# Patient Record
Sex: Female | Born: 1975
Health system: Southern US, Community
[De-identification: ages and names within clinical notes are randomized; demographics above are authoritative.]

## PROBLEM LIST (undated history)

## (undated) DIAGNOSIS — Z6791 Unspecified blood type, Rh negative: Secondary | ICD-10-CM

## (undated) DIAGNOSIS — B379 Candidiasis, unspecified: Secondary | ICD-10-CM

## (undated) HISTORY — DX: Candidiasis, unspecified: B37.9

## (undated) HISTORY — DX: Unspecified blood type, rh negative: Z67.91

---

## 2003-02-23 ENCOUNTER — Inpatient Hospital Stay (HOSPITAL_COMMUNITY): Admission: AD | Admit: 2003-02-23 | Discharge: 2003-02-26 | Payer: Self-pay | Admitting: Obstetrics and Gynecology

## 2003-07-29 ENCOUNTER — Other Ambulatory Visit: Admission: RE | Admit: 2003-07-29 | Discharge: 2003-07-29 | Payer: Self-pay | Admitting: Obstetrics and Gynecology

## 2004-07-30 ENCOUNTER — Other Ambulatory Visit: Admission: RE | Admit: 2004-07-30 | Discharge: 2004-07-30 | Payer: Self-pay | Admitting: Obstetrics and Gynecology

## 2005-05-31 ENCOUNTER — Inpatient Hospital Stay (HOSPITAL_COMMUNITY): Admission: AD | Admit: 2005-05-31 | Discharge: 2005-05-31 | Payer: Self-pay | Admitting: Obstetrics and Gynecology

## 2005-08-12 ENCOUNTER — Inpatient Hospital Stay (HOSPITAL_COMMUNITY): Admission: RE | Admit: 2005-08-12 | Discharge: 2005-08-15 | Payer: Self-pay | Admitting: Obstetrics and Gynecology

## 2005-09-30 HISTORY — PX: OTHER SURGICAL HISTORY: SHX169

## 2005-10-02 ENCOUNTER — Other Ambulatory Visit: Admission: RE | Admit: 2005-10-02 | Discharge: 2005-10-02 | Payer: Self-pay | Admitting: Obstetrics and Gynecology

## 2012-12-02 ENCOUNTER — Encounter: Payer: Self-pay | Admitting: Obstetrics and Gynecology

## 2012-12-02 ENCOUNTER — Ambulatory Visit: Payer: BC Managed Care – PPO | Admitting: Obstetrics and Gynecology

## 2012-12-02 VITALS — BP 102/64 | Resp 14 | Ht 62.0 in | Wt 141.0 lb

## 2012-12-02 DIAGNOSIS — Z01419 Encounter for gynecological examination (general) (routine) without abnormal findings: Secondary | ICD-10-CM

## 2012-12-02 DIAGNOSIS — Z124 Encounter for screening for malignant neoplasm of cervix: Secondary | ICD-10-CM

## 2012-12-02 DIAGNOSIS — T8389XA Other specified complication of genitourinary prosthetic devices, implants and grafts, initial encounter: Secondary | ICD-10-CM

## 2012-12-02 NOTE — Progress Notes (Signed)
Patient ID: Pamela Morris, female   DOB: 1976/09/09, 37 y.o.   MRN: [id number] The patient reports:she complains of Vag cyst that she'd like evaluated. She has no menses with Mirena, no complaints  Contraception:IUD 06/15/2009  Last mammogram: patient has never had a mammogram  Last pap: was normal and not applicable 2010 wnl/ Neg HPV  GC/Chlamydia cultures offered: declined HIV/RPR/HbsAg offered:  declined HSV 1 and 2 glycoprotein offered: declined  Menstrual cycle regular and monthly: No/ Mirena Menstrual flow normal: No  Urinary symptoms: none Normal bowel movements: Yes Reports abuse at home: No  Subjective:    Pamela Morris is a 37 y.o. female, G3, P2, who presents for an annual exam.     History   Social History  . Marital Status: Married    Spouse Name: N/A    Number of Children: N/A  . Years of Education: N/A   Social History Main Topics  . Smoking status: Never Smoker   . Smokeless tobacco: None  . Alcohol Use: None  . Drug Use: None  . Sexually Active: None   Other Topics Concern  . None   Social History Narrative  . None    Menstrual cycle:   LMP: No LMP recorded. Patient is not currently having periods (Reason: IUD).           Cycle: amenorrhea  The following portions of the patient's history were reviewed and updated as appropriate: allergies, current medications, past family history, past medical history, past social history, past surgical history and problem list.  Review of Systems Pertinent items are noted in HPI. Breast:Negative for breast lump,nipple discharge or nipple retraction Gastrointestinal: Negative for abdominal pain, change in bowel habits or rectal bleeding Urinary:negative   Objective:    BP 102/64  Resp 14  Ht 5\' 2"  (1.575 m)  Wt 141 lb (63.957 kg)  BMI 25.78 kg/m2    Weight:  Wt Readings from Last 1 Encounters:  12/02/12 141 lb (63.957 kg)          BMI: Body mass index is 25.78 kg/(m^2).  General Appearance:  Alert, appropriate appearance for age. No acute distress HEENT: Grossly normal Neck / Thyroid: Supple, no masses, nodes or enlargement Lungs: clear to auscultation bilaterally Back: No CVA tenderness Breast Exam: No masses or nodes.No dimpling, nipple retraction or discharge. Cardiovascular: Regular rate and rhythm. S1, S2, no murmur Gastrointestinal: Soft, non-tender, no masses or organomegaly Pelvic Exam: Vulva and vagina appear normal. Bimanual exam reveals normal uterus and adnexa. Strings not visible Rectovaginal: not indicated Lymphatic Exam: Non-palpable nodes in neck, clavicular, axillary, or inguinal regions  Skin: no rash or abnormalities Neurologic: Normal gait and speech, no tremor  Psychiatric: Alert and oriented, appropriate affect.   Assessment:    Normal gyn exam    Plan:    mammogram pap smear return annually or prn STD screening: declined Contraception:IUD U/S next visit for IUD position  Silverio Lay MD

## 2012-12-03 LAB — PAP IG W/ RFLX HPV ASCU

## 2012-12-07 LAB — HUMAN PAPILLOMAVIRUS, HIGH RISK: HPV DNA High Risk: NOT DETECTED

## 2012-12-09 NOTE — Progress Notes (Signed)
Quick Note:  Please send ASCUS letter. Pap in 1 year. ______ 

## 2012-12-10 ENCOUNTER — Encounter: Payer: Self-pay | Admitting: Obstetrics and Gynecology

## 2014-07-31 HISTORY — PX: BREAST ENHANCEMENT SURGERY: SHX7

## 2014-08-01 ENCOUNTER — Encounter: Payer: Self-pay | Admitting: Obstetrics and Gynecology

## 2016-04-10 ENCOUNTER — Encounter (HOSPITAL_COMMUNITY): Payer: Self-pay | Admitting: *Deleted

## 2016-04-10 DIAGNOSIS — K353 Acute appendicitis with localized peritonitis: Secondary | ICD-10-CM | POA: Diagnosis not present

## 2016-04-10 DIAGNOSIS — Z793 Long term (current) use of hormonal contraceptives: Secondary | ICD-10-CM | POA: Insufficient documentation

## 2016-04-10 DIAGNOSIS — R1031 Right lower quadrant pain: Secondary | ICD-10-CM | POA: Diagnosis present

## 2016-04-10 LAB — COMPREHENSIVE METABOLIC PANEL
ALK PHOS: 62 U/L (ref 38–126)
ALT: 14 U/L (ref 14–54)
ANION GAP: 7 (ref 5–15)
AST: 17 U/L (ref 15–41)
Albumin: 4.3 g/dL (ref 3.5–5.0)
BUN: 9 mg/dL (ref 6–20)
CALCIUM: 9 mg/dL (ref 8.9–10.3)
CO2: 25 mmol/L (ref 22–32)
CREATININE: 0.67 mg/dL (ref 0.44–1.00)
Chloride: 103 mmol/L (ref 101–111)
Glucose, Bld: 126 mg/dL — ABNORMAL HIGH (ref 65–99)
Potassium: 3.9 mmol/L (ref 3.5–5.1)
SODIUM: 135 mmol/L (ref 135–145)
Total Bilirubin: 0.9 mg/dL (ref 0.3–1.2)
Total Protein: 7.2 g/dL (ref 6.5–8.1)

## 2016-04-10 LAB — CBC
HCT: 40.4 % (ref 36.0–46.0)
HEMOGLOBIN: 13.8 g/dL (ref 12.0–15.0)
MCH: 30.5 pg (ref 26.0–34.0)
MCHC: 34.2 g/dL (ref 30.0–36.0)
MCV: 89.4 fL (ref 78.0–100.0)
Platelets: 288 10*3/uL (ref 150–400)
RBC: 4.52 MIL/uL (ref 3.87–5.11)
RDW: 12.3 % (ref 11.5–15.5)
WBC: 23.9 10*3/uL — AB (ref 4.0–10.5)

## 2016-04-10 LAB — URINE MICROSCOPIC-ADD ON

## 2016-04-10 LAB — URINALYSIS, ROUTINE W REFLEX MICROSCOPIC
Bilirubin Urine: NEGATIVE
Glucose, UA: NEGATIVE mg/dL
Ketones, ur: >80 mg/dL — AB
LEUKOCYTES UA: NEGATIVE
NITRITE: NEGATIVE
PH: 5.5 (ref 5.0–8.0)
Protein, ur: NEGATIVE mg/dL
SPECIFIC GRAVITY, URINE: 1.025 (ref 1.005–1.030)

## 2016-04-10 LAB — LIPASE, BLOOD: LIPASE: 17 U/L (ref 11–51)

## 2016-04-10 MED ORDER — FENTANYL CITRATE (PF) 100 MCG/2ML IJ SOLN
50.0000 ug | Freq: Once | INTRAMUSCULAR | Status: DC
  Filled 2016-04-10: qty 2

## 2016-04-10 MED ORDER — ONDANSETRON 4 MG PO TBDP
8.0000 mg | ORAL_TABLET | Freq: Once | ORAL | Status: AC
  Administered 2016-04-11: 8 mg via ORAL
  Filled 2016-04-10: qty 2

## 2016-04-10 NOTE — ED Notes (Signed)
The pts abd pain has increased

## 2016-04-10 NOTE — ED Notes (Signed)
The pt is c/o abd  Pain since 1100am today  With nausea and vomiting.  She had alcohol last pm  approx 4 drinks  She felt ok this am lmp mirana

## 2016-04-11 ENCOUNTER — Observation Stay (HOSPITAL_COMMUNITY)
Admission: EM | Admit: 2016-04-11 | Discharge: 2016-04-12 | Disposition: A | Discharge status: Home / Self Care | Payer: BLUE CROSS/BLUE SHIELD | Attending: General Surgery | Admitting: General Surgery

## 2016-04-11 ENCOUNTER — Emergency Department (HOSPITAL_COMMUNITY): Payer: BLUE CROSS/BLUE SHIELD

## 2016-04-11 ENCOUNTER — Encounter (HOSPITAL_COMMUNITY)
Admission: EM | Disposition: A | Discharge status: Home / Self Care | Payer: Self-pay | Source: Home / Self Care | Attending: Emergency Medicine

## 2016-04-11 ENCOUNTER — Observation Stay (HOSPITAL_COMMUNITY): Payer: BLUE CROSS/BLUE SHIELD | Admitting: Anesthesiology

## 2016-04-11 ENCOUNTER — Encounter (HOSPITAL_COMMUNITY): Payer: Self-pay | Admitting: Radiology

## 2016-04-11 DIAGNOSIS — R1031 Right lower quadrant pain: Secondary | ICD-10-CM

## 2016-04-11 DIAGNOSIS — K3580 Unspecified acute appendicitis: Secondary | ICD-10-CM | POA: Diagnosis present

## 2016-04-11 HISTORY — PX: LAPAROSCOPIC APPENDECTOMY: SHX408

## 2016-04-11 LAB — I-STAT BETA HCG BLOOD, ED (MC, WL, AP ONLY): I-stat hCG, quantitative: <5 m[IU]/mL (ref <5)

## 2016-04-11 LAB — SURGICAL PCR SCREEN
MRSA, PCR: NEGATIVE
Staphylococcus aureus: NEGATIVE

## 2016-04-11 SURGERY — APPENDECTOMY, LAPAROSCOPIC
Anesthesia: General | Site: Abdomen

## 2016-04-11 MED ORDER — SODIUM CHLORIDE 0.9 % IR SOLN
Status: DC | PRN
  Administered 2016-04-11: 1000 mL

## 2016-04-11 MED ORDER — HYDROCODONE-ACETAMINOPHEN 7.5-325 MG PO TABS
1.0000 | ORAL_TABLET | Freq: Once | ORAL | Status: AC | PRN
  Administered 2016-04-11: 1 via ORAL

## 2016-04-11 MED ORDER — ONDANSETRON HCL 4 MG/2ML IJ SOLN
4.0000 mg | Freq: Once | INTRAMUSCULAR | Status: AC
  Administered 2016-04-11: 4 mg via INTRAVENOUS
  Filled 2016-04-11: qty 2

## 2016-04-11 MED ORDER — DIPHENHYDRAMINE HCL 12.5 MG/5ML PO ELIX: 12.5000 mg | ORAL_SOLUTION | Freq: Four times a day (QID) | ORAL | Status: DC | PRN

## 2016-04-11 MED ORDER — HYDROCODONE-ACETAMINOPHEN 7.5-325 MG PO TABS
ORAL_TABLET | ORAL | Status: AC
  Filled 2016-04-11: qty 1

## 2016-04-11 MED ORDER — DIPHENHYDRAMINE HCL 50 MG/ML IJ SOLN: 12.5000 mg | Freq: Four times a day (QID) | INTRAMUSCULAR | Status: DC | PRN

## 2016-04-11 MED ORDER — PROPOFOL 10 MG/ML IV BOLUS
INTRAVENOUS | Status: AC
  Filled 2016-04-11: qty 20

## 2016-04-11 MED ORDER — FENTANYL CITRATE (PF) 250 MCG/5ML IJ SOLN
INTRAMUSCULAR | Status: AC
  Filled 2016-04-11: qty 5

## 2016-04-11 MED ORDER — ROCURONIUM BROMIDE 50 MG/5ML IV SOLN
INTRAVENOUS | Status: AC
  Filled 2016-04-11: qty 1

## 2016-04-11 MED ORDER — HYDROMORPHONE HCL 1 MG/ML IJ SOLN
INTRAMUSCULAR | Status: AC
  Filled 2016-04-11: qty 1

## 2016-04-11 MED ORDER — ACETAMINOPHEN 650 MG RE SUPP: 650.0000 mg | Freq: Four times a day (QID) | RECTAL | Status: DC | PRN

## 2016-04-11 MED ORDER — DEXAMETHASONE SODIUM PHOSPHATE 10 MG/ML IJ SOLN
INTRAMUSCULAR | Status: AC
  Filled 2016-04-11: qty 1

## 2016-04-11 MED ORDER — KETOROLAC TROMETHAMINE 30 MG/ML IJ SOLN: 30.0000 mg | Freq: Four times a day (QID) | INTRAMUSCULAR | Status: DC | PRN

## 2016-04-11 MED ORDER — ACETAMINOPHEN 325 MG PO TABS
650.0000 mg | ORAL_TABLET | Freq: Four times a day (QID) | ORAL | Status: DC | PRN
  Administered 2016-04-11: 650 mg via ORAL
  Filled 2016-04-11: qty 2

## 2016-04-11 MED ORDER — NEOSTIGMINE METHYLSULFATE 5 MG/5ML IV SOSY
PREFILLED_SYRINGE | INTRAVENOUS | Status: AC
  Filled 2016-04-11: qty 5

## 2016-04-11 MED ORDER — ACETAMINOPHEN 325 MG PO TABS
650.0000 mg | ORAL_TABLET | Freq: Once | ORAL | Status: AC
  Administered 2016-04-11: 650 mg via ORAL
  Filled 2016-04-11: qty 2

## 2016-04-11 MED ORDER — ONDANSETRON HCL 4 MG/2ML IJ SOLN
INTRAMUSCULAR | Status: AC
  Filled 2016-04-11: qty 2

## 2016-04-11 MED ORDER — PROMETHAZINE HCL 25 MG/ML IJ SOLN: 6.2500 mg | INTRAMUSCULAR | Status: DC | PRN

## 2016-04-11 MED ORDER — SODIUM CHLORIDE 0.9 % IV BOLUS (SEPSIS)
1000.0000 mL | Freq: Once | INTRAVENOUS | Status: AC
  Administered 2016-04-11: 1000 mL via INTRAVENOUS

## 2016-04-11 MED ORDER — KETOROLAC TROMETHAMINE 30 MG/ML IJ SOLN
INTRAMUSCULAR | Status: AC
  Filled 2016-04-11: qty 1

## 2016-04-11 MED ORDER — FENTANYL CITRATE (PF) 100 MCG/2ML IJ SOLN
INTRAMUSCULAR | Status: DC | PRN
  Administered 2016-04-11: 50 ug via INTRAVENOUS
  Administered 2016-04-11: 100 ug via INTRAVENOUS
  Administered 2016-04-11 (×2): 50 ug via INTRAVENOUS

## 2016-04-11 MED ORDER — PROPOFOL 10 MG/ML IV BOLUS
INTRAVENOUS | Status: DC | PRN
  Administered 2016-04-11: 150 mg via INTRAVENOUS

## 2016-04-11 MED ORDER — FENTANYL CITRATE (PF) 100 MCG/2ML IJ SOLN
50.0000 ug | Freq: Once | INTRAMUSCULAR | Status: AC
  Administered 2016-04-11: 50 ug via INTRAMUSCULAR

## 2016-04-11 MED ORDER — ONDANSETRON 4 MG PO TBDP: 4.0000 mg | ORAL_TABLET | Freq: Four times a day (QID) | ORAL | Status: DC | PRN

## 2016-04-11 MED ORDER — SIMETHICONE 80 MG PO CHEW: 40.0000 mg | CHEWABLE_TABLET | Freq: Four times a day (QID) | ORAL | Status: DC | PRN

## 2016-04-11 MED ORDER — BUPIVACAINE-EPINEPHRINE 0.25% -1:200000 IJ SOLN
INTRAMUSCULAR | Status: DC | PRN
  Administered 2016-04-11: 19 mL

## 2016-04-11 MED ORDER — LACTATED RINGERS IV SOLN
INTRAVENOUS | Status: DC
  Administered 2016-04-11 (×4): via INTRAVENOUS

## 2016-04-11 MED ORDER — GLYCOPYRROLATE 0.2 MG/ML IJ SOLN
INTRAMUSCULAR | Status: DC | PRN
  Administered 2016-04-11: 0.6 mg via INTRAVENOUS

## 2016-04-11 MED ORDER — ROCURONIUM BROMIDE 100 MG/10ML IV SOLN
INTRAVENOUS | Status: DC | PRN
  Administered 2016-04-11: 40 mg via INTRAVENOUS

## 2016-04-11 MED ORDER — MORPHINE SULFATE (PF) 2 MG/ML IV SOLN
2.0000 mg | INTRAVENOUS | Status: DC | PRN
Start: 2016-04-11 — End: 2016-04-12

## 2016-04-11 MED ORDER — KCL IN DEXTROSE-NACL 20-5-0.45 MEQ/L-%-% IV SOLN
INTRAVENOUS | Status: DC
  Administered 2016-04-11: 09:00:00 via INTRAVENOUS
  Filled 2016-04-11: qty 1000

## 2016-04-11 MED ORDER — DEXTROSE 5 % IV SOLN
2.0000 g | INTRAVENOUS | Status: DC
  Administered 2016-04-11 – 2016-04-12 (×2): 2 g via INTRAVENOUS
  Filled 2016-04-11 (×2): qty 2

## 2016-04-11 MED ORDER — 0.9 % SODIUM CHLORIDE (POUR BTL) OPTIME
TOPICAL | Status: DC | PRN
  Administered 2016-04-11: 1000 mL

## 2016-04-11 MED ORDER — PHENYLEPHRINE HCL 10 MG/ML IJ SOLN
INTRAMUSCULAR | Status: DC | PRN
Start: 2016-04-11 — End: 2016-04-11
  Administered 2016-04-11: 80 ug via INTRAVENOUS
  Administered 2016-04-11: 120 ug via INTRAVENOUS

## 2016-04-11 MED ORDER — LIDOCAINE HCL (CARDIAC) 20 MG/ML IV SOLN
INTRAVENOUS | Status: DC | PRN
  Administered 2016-04-11: 60 mg via INTRAVENOUS

## 2016-04-11 MED ORDER — KETOROLAC TROMETHAMINE 30 MG/ML IJ SOLN
30.0000 mg | Freq: Four times a day (QID) | INTRAMUSCULAR | Status: AC
  Administered 2016-04-11 – 2016-04-12 (×3): 30 mg via INTRAVENOUS
  Filled 2016-04-11 (×3): qty 1

## 2016-04-11 MED ORDER — DEXAMETHASONE SODIUM PHOSPHATE 10 MG/ML IJ SOLN
INTRAMUSCULAR | Status: DC | PRN
  Administered 2016-04-11: 10 mg via INTRAVENOUS

## 2016-04-11 MED ORDER — HYDROMORPHONE HCL 1 MG/ML IJ SOLN
0.5000 mg | INTRAMUSCULAR | Status: DC | PRN
  Administered 2016-04-11: 2 mg via INTRAVENOUS
  Administered 2016-04-11 – 2016-04-12 (×2): 1 mg via INTRAVENOUS
  Filled 2016-04-11 (×2): qty 1
  Filled 2016-04-11: qty 2

## 2016-04-11 MED ORDER — METRONIDAZOLE IN NACL 5-0.79 MG/ML-% IV SOLN
500.0000 mg | Freq: Three times a day (TID) | INTRAVENOUS | Status: DC
  Administered 2016-04-11 – 2016-04-12 (×4): 500 mg via INTRAVENOUS
  Filled 2016-04-11 (×5): qty 100

## 2016-04-11 MED ORDER — MIDAZOLAM HCL 2 MG/2ML IJ SOLN
INTRAMUSCULAR | Status: AC
  Filled 2016-04-11: qty 2

## 2016-04-11 MED ORDER — GLYCOPYRROLATE 0.2 MG/ML IV SOSY
PREFILLED_SYRINGE | INTRAVENOUS | Status: AC
  Filled 2016-04-11: qty 3

## 2016-04-11 MED ORDER — MORPHINE SULFATE (PF) 4 MG/ML IV SOLN
4.0000 mg | Freq: Once | INTRAVENOUS | Status: AC
Start: 2016-04-11 — End: 2016-04-11
  Administered 2016-04-11: 4 mg via INTRAVENOUS
  Filled 2016-04-11: qty 1

## 2016-04-11 MED ORDER — KETOROLAC TROMETHAMINE 30 MG/ML IJ SOLN
30.0000 mg | Freq: Once | INTRAMUSCULAR | Status: AC | PRN
  Administered 2016-04-11: 30 mg via INTRAVENOUS

## 2016-04-11 MED ORDER — MORPHINE SULFATE (PF) 4 MG/ML IV SOLN
4.0000 mg | Freq: Once | INTRAVENOUS | Status: AC
  Administered 2016-04-11: 4 mg via INTRAVENOUS
  Filled 2016-04-11: qty 1

## 2016-04-11 MED ORDER — OXYCODONE HCL 5 MG PO TABS
5.0000 mg | ORAL_TABLET | ORAL | Status: DC | PRN
  Administered 2016-04-12: 5 mg via ORAL
  Filled 2016-04-11: qty 1

## 2016-04-11 MED ORDER — HYDROMORPHONE HCL 1 MG/ML IJ SOLN
0.2500 mg | INTRAMUSCULAR | Status: DC | PRN
  Administered 2016-04-11 (×2): 0.5 mg via INTRAVENOUS

## 2016-04-11 MED ORDER — ONDANSETRON HCL 4 MG/2ML IJ SOLN
4.0000 mg | Freq: Four times a day (QID) | INTRAMUSCULAR | Status: DC | PRN
  Administered 2016-04-11 (×2): 4 mg via INTRAVENOUS
  Filled 2016-04-11: qty 2

## 2016-04-11 MED ORDER — IOPAMIDOL (ISOVUE-300) INJECTION 61%
INTRAVENOUS | Status: AC
  Administered 2016-04-11: 100 mL
  Filled 2016-04-11: qty 100

## 2016-04-11 MED ORDER — NEOSTIGMINE METHYLSULFATE 10 MG/10ML IV SOLN
INTRAVENOUS | Status: DC | PRN
  Administered 2016-04-11: 4 mg via INTRAVENOUS

## 2016-04-11 MED ORDER — BUPIVACAINE-EPINEPHRINE (PF) 0.25% -1:200000 IJ SOLN
INTRAMUSCULAR | Status: AC
  Filled 2016-04-11: qty 30

## 2016-04-11 MED ORDER — OXYCODONE HCL 5 MG PO TABS: 5.0000 mg | ORAL_TABLET | ORAL | Status: DC | PRN

## 2016-04-11 MED ORDER — MIDAZOLAM HCL 5 MG/5ML IJ SOLN
INTRAMUSCULAR | Status: DC | PRN
  Administered 2016-04-11: 2 mg via INTRAVENOUS

## 2016-04-11 SURGICAL SUPPLY — 38 items
APPLIER CLIP ROT 10 11.4 M/L (STAPLE)
BLADE SURG ROTATE 9660 (MISCELLANEOUS) IMPLANT
CANISTER SUCTION 2500CC (MISCELLANEOUS) ×2 IMPLANT
CHLORAPREP W/TINT 26ML (MISCELLANEOUS) ×2 IMPLANT
CLIP APPLIE ROT 10 11.4 M/L (STAPLE) IMPLANT
COVER SURGICAL LIGHT HANDLE (MISCELLANEOUS) ×2 IMPLANT
CUTTER FLEX LINEAR 45M (STAPLE) ×2 IMPLANT
ELECT REM PT RETURN 9FT ADLT (ELECTROSURGICAL) ×2
ELECTRODE REM PT RTRN 9FT ADLT (ELECTROSURGICAL) ×1 IMPLANT
GLOVE BIO SURGEON STRL SZ8 (GLOVE) ×2 IMPLANT
GLOVE BIOGEL PI IND STRL 8 (GLOVE) ×1 IMPLANT
GLOVE BIOGEL PI INDICATOR 8 (GLOVE) ×1
GOWN STRL REUS W/ TWL LRG LVL3 (GOWN DISPOSABLE) ×2 IMPLANT
GOWN STRL REUS W/ TWL XL LVL3 (GOWN DISPOSABLE) ×1 IMPLANT
GOWN STRL REUS W/TWL LRG LVL3 (GOWN DISPOSABLE) ×2
GOWN STRL REUS W/TWL XL LVL3 (GOWN DISPOSABLE) ×1
KIT BASIN OR (CUSTOM PROCEDURE TRAY) ×2 IMPLANT
KIT ROOM TURNOVER OR (KITS) ×2 IMPLANT
LIQUID BAND (GAUZE/BANDAGES/DRESSINGS) ×2 IMPLANT
NEEDLE 22X1 1/2 (OR ONLY) (NEEDLE) ×2 IMPLANT
NS IRRIG 1000ML POUR BTL (IV SOLUTION) ×2 IMPLANT
PAD ARMBOARD 7.5X6 YLW CONV (MISCELLANEOUS) ×4 IMPLANT
POUCH SPECIMEN RETRIEVAL 10MM (ENDOMECHANICALS) ×2 IMPLANT
RELOAD 45 VASCULAR/THIN (ENDOMECHANICALS) IMPLANT
RELOAD STAPLE TA45 3.5 REG BLU (ENDOMECHANICALS) IMPLANT
SCALPEL HARMONIC ACE (MISCELLANEOUS) ×2 IMPLANT
SCISSORS LAP 5X35 DISP (ENDOMECHANICALS) ×2 IMPLANT
SET IRRIG TUBING LAPAROSCOPIC (IRRIGATION / IRRIGATOR) ×2 IMPLANT
SPECIMEN JAR SMALL (MISCELLANEOUS) ×2 IMPLANT
SUT VIC AB 4-0 PS2 27 (SUTURE) ×2 IMPLANT
TOWEL OR 17X24 6PK STRL BLUE (TOWEL DISPOSABLE) ×2 IMPLANT
TOWEL OR 17X26 10 PK STRL BLUE (TOWEL DISPOSABLE) ×2 IMPLANT
TRAY FOLEY CATH 16FR SILVER (SET/KITS/TRAYS/PACK) ×2 IMPLANT
TRAY LAPAROSCOPIC MC (CUSTOM PROCEDURE TRAY) ×2 IMPLANT
TROCAR XCEL 12X100 BLDLESS (ENDOMECHANICALS) ×2 IMPLANT
TROCAR XCEL BLUNT TIP 100MML (ENDOMECHANICALS) ×2 IMPLANT
TROCAR XCEL NON-BLD 5MMX100MML (ENDOMECHANICALS) ×2 IMPLANT
TUBING INSUFFLATION (TUBING) ×2 IMPLANT

## 2016-04-11 NOTE — Transfer of Care (Signed)
Immediate Anesthesia Transfer of Care Note  Patient: Pamela Morris  Procedure(s) Performed: Procedure(s): APPENDECTOMY LAPAROSCOPIC (N/A)  Patient Location: PACU  Anesthesia Type:General  Level of Consciousness: awake, alert , oriented and patient cooperative  Airway & Oxygen Therapy: Patient Spontanous Breathing  Post-op Assessment: Report given to RN and Post -op Vital signs reviewed and stable  Post vital signs: Reviewed and stable  Last Vitals:  Filed Vitals:   04/11/16 0820 04/11/16 1037  BP: 106/67 102/62  Pulse: 72 62  Temp: 36.9 C 36.7 C  Resp: 16 17    Last Pain:  Filed Vitals:   04/11/16 1058  PainSc: 1       Patients Stated Pain Goal: 2 (04/11/16 0831)  Complications: No apparent anesthesia complications

## 2016-04-11 NOTE — Op Note (Signed)
04/11/2016  12:38 PM  PATIENT:  Pamela Morris  40 y.o. female  PRE-OPERATIVE DIAGNOSIS:  acute appendicitis  POST-OPERATIVE DIAGNOSIS:  acute appendicitis  PROCEDURE:  Procedure(s): APPENDECTOMY LAPAROSCOPIC  SURGEON:  Surgeon(s): Violeta GelinasBurke Juanmanuel Marohl, MD  ASSISTANTS: none   ANESTHESIA:   local and general  EBL:  Total I/O In: 1000 [I.V.:1000] Out: -   BLOOD ADMINISTERED:none  DRAINS: none   SPECIMEN:  Excision  DISPOSITION OF SPECIMEN:  PATHOLOGY  COUNTS:  YES  DICTATION: .Dragon Dictation Findings: Acute, nonperforated appendicitis  Procedure in detail: Ms. Sampson GoonFitzgerald presents for appendectomy. She was identified in the preop holding area. Informed consent was obtained. She received intravenous antibiotics. She was brought to the operating room and general endotracheal anesthesia was administered by the anesthesia staff. Foley catheter was placed by nursing. Her abdomen was prepped and draped in a sterile fashion. We did a time out procedure.The infraumbilical region was infiltrated with local. Infraumbilical incision was made. Subcutaneous tissues were dissected down revealing the anterior fascia. This was divided sharply along the midline. Peritoneal cavity was entered under direct vision without complication. A 0 Vicryl pursestring was placed around the fascial opening. Hassan trocar was inserted into the abdomen. The abdomen was insufflated with carbon dioxide in standard fashion. Under direct vision a 12 mm left lower quadrant and a 5 mm right abdominal port were placed. Local was used at each port site. Laparoscopic exploration revealed a small adhesion from her pelvis up to the anterior abdominal wall which was divided easily. The appendix was identified. It was very inflamed and wrapped around the lateral side of her cecum. It was gradually freed up. The mesoappendix was gradually divided using a harmonic scalpel. The base was then divided with an Endo GIA with a vascular  load. There was an excellent staple line and no bleeding. The appendix was placed in a bag and removed from the abdomen. Abdomen was copiously irrigated and the irrigation returned clear. There was no bleeding in the staple line was intact. Four-quadrant inspection revealed no other complicating features. Ports were removed under direct vision. Pneumoperitoneum was released. Infraumbilical fascia was closed by tying the pursestring. All 3 wounds were irrigated and the skin of each was closed with running 4-0 Vicryl subcuticular followed by liquid band. All counts were correct. She tolerated procedure well without apparent complication was taken recovery in stable condition.  PATIENT DISPOSITION:  PACU - hemodynamically stable.   Delay start of Pharmacological VTE agent (>24hrs) due to surgical blood loss or risk of bleeding:  no  Violeta GelinasBurke Baltazar Pekala, MD, MPH, FACS Pager: [phone]  7/13/201712:38 PM

## 2016-04-11 NOTE — ED Notes (Signed)
Attempted report 

## 2016-04-11 NOTE — Discharge Instructions (Signed)
Your appointment is at 10:30 AM on 05/01/16, please arrive at least 30 min before your appointment to complete your check in paperwork.  If you are unable to arrive 30 min prior to your appointment time we may have to cancel or reschedule you.  LAPAROSCOPIC SURGERY: POST OP INSTRUCTIONS  1. DIET: Follow a light bland diet the first 24 hours after arrival home, such as soup, liquids, crackers, etc. Be sure to include lots of fluids daily. Avoid fast food or heavy meals as your are more likely to get nauseated. Eat a low fat the next few days after surgery.  2. Take your usually prescribed home medications unless otherwise directed. 3. PAIN CONTROL:  1. Pain is best controlled by a usual combination of three different methods TOGETHER:  1. Ice/Heat 2. Over the counter pain medication 3. Prescription pain medication 2. Most patients will experience some swelling and bruising around the incisions. Ice packs or heating pads (30-60 minutes up to 6 times a day) will help. Use ice for the first few days to help decrease swelling and bruising, then switch to heat to help relax tight/sore spots and speed recovery. Some people prefer to use ice alone, heat alone, alternating between ice & heat. Experiment to what works for you. Swelling and bruising can take several weeks to resolve.  3. It is helpful to take an over-the-counter pain medication regularly for the first few weeks. Choose one of the following that works best for you:  1. Naproxen (Aleve, etc) Two  tabs twice a day 2. Ibuprofen (Advil, etc) Three  tabs four times a day (every meal & bedtime) 3. Acetaminophen (Tylenol, etc) 500-650mg  four times a day (every meal & bedtime) 4. A prescription for pain medication (such as oxycodone, hydrocodone, etc) should be given to you upon discharge. Take your pain medication as prescribed.  1. If you are having problems/concerns with the prescription medicine (does not control pain, nausea, vomiting,  rash, itching, etc), please call us [phone] to see if we need to switch you to a different pain medicine that will work better for you and/or control your side effect better. 2. If you need a refill on your pain medication, please contact your pharmacy. They will contact our office to request authorization. Prescriptions will not be filled after 5 pm or on week-ends. 4. Avoid getting constipated. Between the surgery and the pain medications, it is common to experience some constipation. Increasing fluid intake and taking a fiber supplement (such as Metamucil, Citrucel, FiberCon, MiraLax, etc) 1-2 times a day regularly will usually help prevent this problem from occurring. A mild laxative (prune juice, Milk of Magnesia, MiraLax, etc) should be taken according to package directions if there are no bowel movements after 48 hours.  5. Watch out for diarrhea. If you have many loose bowel movements, simplify your diet to bland foods & liquids for a few days. Stop any stool softeners and decrease your fiber supplement. Switching to mild anti-diarrheal medications (Kayopectate, Pepto Bismol) can help. If this worsens or does not improve, please call us. 6. Wash / shower every day. You may shower over the dressings as they are waterproof. Continue to shower over incision(s) after the dressing is off. 7. Remove your waterproof bandages 5 days after surgery. You may leave the incision open to air. You may replace a dressing/Band-Aid to cover the incision for comfort if you wish.  8. ACTIVITIES as tolerated:  1. You may resume regular (light) daily activities beginning the  next day--such as daily self-care, walking, climbing stairs--gradually increasing activities as tolerated. If you can walk 30 minutes without difficulty, it is safe to try more intense activity such as jogging, treadmill, bicycling, low-impact aerobics, swimming, etc. 2. Save the most intensive and strenuous activity for last such as sit-ups,  heavy lifting, contact sports, etc Refrain from any heavy lifting or straining until you are off narcotics for pain control.  3. DO NOT PUSH THROUGH PAIN. Let pain be your guide: If it hurts to do something, don't do it. Pain is your body warning you to avoid that activity for another week until the pain goes down. 4. You may drive when you are no longer taking prescription pain medication, you can comfortably wear a seatbelt, and you can safely maneuver your car and apply brakes. 5. You may have sexual intercourse when it is comfortable.  9. FOLLOW UP in our office  1. Please call [facility] at [phone] to set up an appointment to see your surgeon in the office for a follow-up appointment approximately 2-3 weeks after your surgery. 2. Make sure that you call for this appointment the day you arrive home to insure a convenient appointment time.      10. IF YOU HAVE DISABILITY OR FAMILY LEAVE FORMS, BRING THEM TO THE               OFFICE FOR PROCESSING.   WHEN TO CALL us [phone]:  1. Poor pain control 2. Reactions / problems with new medications (rash/itching, nausea, etc)  3. Fever over 101.5 F (38.5 C) 4. Inability to urinate 5. Nausea and/or vomiting 6. Worsening swelling or bruising 7. Continued bleeding from incision. 8. Increased pain, redness, or drainage from the incision  The clinic staff is available to answer your questions during regular business hours (8:30am-5pm). Please dont hesitate to call and ask to speak to one of our nurses for clinical concerns.  If you have a medical emergency, go to the nearest emergency room or call 911.  A surgeon from [facility] is always on call at the [facility], [place]  [address], [address], [place], [place] [address] ?  MAIN: [phone] ? TOLL FREE: [phone] ?  FAX [phone]  [email or web address] Appendicitis Appendicitis means the appendix is puffy (inflamed). You  need to get medical help right away. Without help, your problems can get much worse. The appendix can develop a hole (perforation). A pocket of yellowish-white fluid (abscess) can leak from the appendix. This can make you very sick. SYMPTOMS   Pain around the belly button (navel). The pain later moves toward the lower right belly (abdomen). The pain may be strong and sharp.  Tenderness in the lower right belly. The pain feels worse if you cough or move suddenly.  Feeling sick to your stomach (nausea).  Throwing up (vomiting).  No desire to eat (loss of appetite).  Fever.  Having a hard time pooping (constipation).  Watery poop (diarrhea).  Generally not feeling well. TREATMENT  In most cases, surgery is done to take out the appendix. This is done as soon as possible. This surgery is called appendectomy. Most people go home in 24 to 48 hours after surgery. If the appendix has a hole, surgery might be delayed. Any yellowish-white fluid will be removed with a drain. A drain removes fluid from the body. You may be given antibiotic medicine that kills germs. This medicine is given through a tube  in your vein (IV). You may still need surgery after the fluid has been drained. You may need to stay in the hospital longer than 48 hours.   This information is not intended to replace advice given to you by your health care provider. Make sure you discuss any questions you have with your health care provider.   Document Released: 12/09/2011 Document Reviewed: 02/01/2015 Elsevier Interactive Patient Education [employer]2016 [employer] Inc.

## 2016-04-11 NOTE — Anesthesia Postprocedure Evaluation (Signed)
Anesthesia Post Note  Patient: Pamela Morris  Procedure(s) Performed: Procedure(s) (LRB): APPENDECTOMY LAPAROSCOPIC (N/A)  Patient location during evaluation: PACU Anesthesia Type: General Level of consciousness: awake and alert Pain management: pain level controlled Vital Signs Assessment: post-procedure vital signs reviewed and stable Respiratory status: spontaneous breathing, nonlabored ventilation, respiratory function stable and patient connected to nasal cannula oxygen Cardiovascular status: blood pressure returned to baseline and stable Postop Assessment: no signs of nausea or vomiting Anesthetic complications: no    Last Vitals:  Filed Vitals:   04/11/16 1330 04/11/16 1401  BP: 109/52 106/59  Pulse: 76 70  Temp: 36.6 C 36.9 C  Resp: 15 16    Last Pain:  Filed Vitals:   04/11/16 1402  PainSc: 1                  Kennieth RadFitzgerald, Lizett Chowning E

## 2016-04-11 NOTE — ED Notes (Signed)
Attempted to call report

## 2016-04-11 NOTE — Anesthesia Preprocedure Evaluation (Addendum)
Anesthesia Evaluation  Patient identified by MRN, date of birth, ID band Patient awake    Reviewed: Allergy & Precautions, NPO status , Patient's Chart, lab work & pertinent test results  History of Anesthesia Complications Negative for: history of anesthetic complications  Airway Mallampati: II  TM Distance: <3 FB Neck ROM: Full    Dental  (+) Teeth Intact, Dental Advisory Given   Pulmonary Current Smoker,    breath sounds clear to auscultation       Cardiovascular negative cardio ROS   Rhythm:Regular Rate:Normal     Neuro/Psych negative neurological ROS  negative psych ROS   GI/Hepatic Neg liver ROS, Acute appendicitis   Endo/Other  negative endocrine ROS  Renal/GU negative Renal ROS     Musculoskeletal negative musculoskeletal ROS (+)   Abdominal   Peds  Hematology negative hematology ROS (+)   Anesthesia Other Findings   Reproductive/Obstetrics                           Lab Results  Component Value Date   WBC 23.9* 04/10/2016   HGB 13.8 04/10/2016   HCT 40.4 04/10/2016   MCV 89.4 04/10/2016   PLT 288 04/10/2016   Lab Results  Component Value Date   CREATININE 0.67 04/10/2016   BUN 9 04/10/2016   NA 135 04/10/2016   K 3.9 04/10/2016   CL 103 04/10/2016   CO2 25 04/10/2016    Anesthesia Physical Anesthesia Plan  ASA: II  Anesthesia Plan: General   Post-op Pain Management:    Induction: Intravenous  Airway Management Planned: Oral ETT  Additional Equipment:   Intra-op Plan:   Post-operative Plan: Extubation in OR  Informed Consent: I have reviewed the patients History and Physical, chart, labs and discussed the procedure including the risks, benefits and alternatives for the proposed anesthesia with the patient or authorized representative who has indicated his/her understanding and acceptance.   Dental advisory given  Plan Discussed with:  CRNA  Anesthesia Plan Comments:        Anesthesia Quick Evaluation

## 2016-04-11 NOTE — ED Notes (Signed)
Patient transported to CT 

## 2016-04-11 NOTE — Progress Notes (Signed)
Day of Surgery  Subjective: RLQ pain  Objective: Vital signs in last 24 hours: Temp:  [98.1 F (36.7 C)-100.3 F (37.9 C)] 98.1 F (36.7 C) (07/13 1037) Pulse Rate:  [62-81] 62 (07/13 1037) Resp:  [14-20] 17 (07/13 1037) BP: (102-130)/(53-76) 102/62 mmHg (07/13 1037) SpO2:  [93 %-100 %] 99 % (07/13 1037) Weight:  [70.5 kg (155 lb 6.8 oz)-70.563 kg (155 lb 9 oz)] 70.5 kg (155 lb 6.8 oz) (07/13 0820)    Intake/Output from previous day:   Intake/Output this shift:    General appearance: alert and cooperative Resp: clear to auscultation bilaterally Cardio: S1, S2 normal GI: soft, tender RLQ with vol guarding  Lab Results:   Recent Labs  04/10/16 2140  WBC 23.9*  HGB 13.8  HCT 40.4  PLT 288   BMET  Recent Labs  04/10/16 2140  NA 135  K 3.9  CL 103  CO2 25  GLUCOSE 126*  BUN 9  CREATININE 0.67  CALCIUM 9.0   PT/INR No results for input(s): LABPROT, INR in the last 72 hours. ABG No results for input(s): PHART, HCO3 in the last 72 hours.  Invalid input(s): PCO2, PO2  Studies/Results: Ct Abdomen Pelvis W Contrast  04/11/2016  CLINICAL DATA:  Right lower quadrant pain, nausea, and vomiting since 11 a.m. today. EXAM: CT ABDOMEN AND PELVIS WITH CONTRAST TECHNIQUE: Multidetector CT imaging of the abdomen and pelvis was performed using the standard protocol following bolus administration of intravenous contrast. CONTRAST:  100mL ISOVUE-300 IOPAMIDOL (ISOVUE-300) INJECTION 61% COMPARISON:  None. FINDINGS: Mild dependent changes in the lung bases. Bilateral breast implants. Small low-attenuation lesion in the dome of the liver with peripheral enhancement in a nodular pattern suggesting cavernous hemangioma. Gallbladder, pancreas, spleen, adrenal glands, kidneys, abdominal aorta, inferior vena cava, and retroperitoneal lymph nodes are unremarkable. Stomach, small bowel, and colon are not abnormally distended. No free air or free fluid in the abdomen. Small umbilical hernia  containing fat. Pelvis: The appendix is distended and thick walled with diameter of about 12 mm. There is periappendiceal edema and infiltration. Changes are consistent with acute appendicitis. No abscess. Uterus and ovaries are not enlarged. Intrauterine device is present. Bladder is decompressed. No free or loculated pelvic fluid collections. Scattered areas of increased density demonstrated throughout the bowel and colon likely represent ingested medication. No destructive bone lesions. IMPRESSION: Changes of acute appendicitis. No abscess. 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 Electronically Signed   By: Burman NievesWilliam  Stevens M.D.   On: 04/11/2016 04:09    Anti-infectives: Anti-infectives    Start     Dose/Rate Route Frequency Ordered Stop   04/11/16 0800  [MAR Hold]  cefTRIAXone (ROCEPHIN) 2 g in dextrose 5 % 50 mL IVPB     (MAR Hold since 04/11/16 1101)   2 g 100 mL/hr over 30 Minutes Intravenous Every 24 hours 04/11/16 0628     04/11/16 0800  [MAR Hold]  metroNIDAZOLE (FLAGYL) IVPB 500 mg     (MAR Hold since 04/11/16 1101)   500 mg 100 mL/hr over 60 Minutes Intravenous Every 8 hours 04/11/16 [id number]        Assessment/Plan: Acute appendicitis - to OR for laparoscopic appendectomy. Procedure, risks, and benefits discussed and she agrees. Violeta GelinasBurke Perlie Scheuring, MD, MPH, FACS Trauma: [phone] General Surgery: [phone]       Violeta GelinasHOMPSON,Aasha Dina E 04/11/2016

## 2016-04-11 NOTE — ED Notes (Signed)
The pt appears much more in distress her pain has increased and she has a low grade temp now. Acuity increased

## 2016-04-11 NOTE — ED Provider Notes (Signed)
CSN: [id number]     Arrival date & time 04/10/16  2005 History   First MD Initiated Contact with Patient 04/11/16 0051     Chief Complaint  Patient presents with  . Abdominal Pain     (Consider location/radiation/quality/duration/timing/severity/associated sxs/prior Treatment) HPI Comments: Patient presents today with RLQ abdominal pain.  She reports onset of pain this morning.  Pain has been constant since that time.  She states that initially the pain was periumbilical, but she now feels that the pain is radiating to her RLQ.  Pain worse with palpation and movement.  Pain associated with 4-5 episodes of vomiting.  She denies diarrhea or constipation.  She has taken Pepto-Bismol for her symptoms without improvement.  She denies fever, chills, urinary symptoms, hematemesis, or vaginal discharge.  Past surgical history significant for c-section, but no other abdominal surgeries.    The history is provided by the patient.    Past Medical History  Diagnosis Date  . Yeast infection   . Blood type, Rh negative    Past Surgical History  Procedure Laterality Date  . Cesarean section     Family History  Problem Relation Age of Onset  . Peptic Ulcer Father   . Hypertension Mother    Social History  Substance Use Topics  . Smoking status: Never Smoker   . Smokeless tobacco: None  . Alcohol Use: 0.0 oz/week    2-3 drink(s) per week   OB History    Gravida Para Term Preterm AB TAB SAB Ectopic Multiple Living   Review of Systems  All other systems reviewed and are negative.     Allergies  Review of patient's allergies indicates no known allergies.  Home Medications   Prior to Admission medications   Medication Sig Start Date End Date Taking? Authorizing Provider  levonorgestrel (MIRENA) 20 MCG/24HR IUD 1 each by Intrauterine route once.    Historical Provider, MD   BP 119/67 mmHg  Pulse 66  Temp(Src) 100.2 F (37.9 C) (Oral)  Resp 20  Ht  (1.575  m)  Wt 70.563 kg  BMI 28.45 kg/m2  SpO2 100% Physical Exam  Constitutional: She appears well-developed and well-nourished.  HENT:  Head: Normocephalic and atraumatic.  Mouth/Throat: Oropharynx is clear and moist.  Neck: Normal range of motion. Neck supple.  Cardiovascular: Normal rate, regular rhythm and normal heart sounds.   Pulmonary/Chest: Effort normal and breath sounds normal.  Abdominal: Soft. Bowel sounds are normal. She exhibits no distension and no mass. There is tenderness in the right lower quadrant. There is guarding and tenderness at McBurney's point. There is no rebound.  Positive Rovsing's sign  Musculoskeletal: Normal range of motion.  Neurological: She is alert.  Skin: Skin is warm and dry.  Psychiatric: She has a normal mood and affect.  Nursing note and vitals reviewed.   ED Course  Procedures (including critical care time) Labs Review Labs Reviewed  COMPREHENSIVE METABOLIC PANEL - Abnormal; Notable for the following:    Glucose, Bld 126 (*)    All other components within normal limits  CBC - Abnormal; Notable for the following:    WBC 23.9 (*)    All other components within normal limits  URINALYSIS, ROUTINE W REFLEX MICROSCOPIC (NOT AT [facility]) - Abnormal; Notable for the following:    Hgb urine dipstick LARGE (*)    Ketones, ur >80 (*)    All other components within normal  limits  URINE MICROSCOPIC-ADD ON - Abnormal; Notable for the following:    Squamous Epithelial / LPF 0-5 (*)    Bacteria, UA RARE (*)    All other components within normal limits  LIPASE, BLOOD  POC URINE PREG, ED    Imaging Review Ct Abdomen Pelvis W Contrast  04/11/2016  CLINICAL DATA:  Right lower quadrant pain, nausea, and vomiting since 11 a.m. today. EXAM: CT ABDOMEN AND PELVIS WITH CONTRAST TECHNIQUE: Multidetector CT imaging of the abdomen and pelvis was performed using the standard protocol following bolus administration of intravenous contrast. CONTRAST:  100mL ISOVUE-300  IOPAMIDOL (ISOVUE-300) INJECTION 61% COMPARISON:  None. FINDINGS: Mild dependent changes in the lung bases. Bilateral breast implants. Small low-attenuation lesion in the dome of the liver with peripheral enhancement in a nodular pattern suggesting cavernous hemangioma. Gallbladder, pancreas, spleen, adrenal glands, kidneys, abdominal aorta, inferior vena cava, and retroperitoneal lymph nodes are unremarkable. Stomach, small bowel, and colon are not abnormally distended. No free air or free fluid in the abdomen. Small umbilical hernia containing fat. Pelvis: The appendix is distended and thick walled with diameter of about 12 mm. There is periappendiceal edema and infiltration. Changes are consistent with acute appendicitis. No abscess. Uterus and ovaries are not enlarged. Intrauterine device is present. Bladder is decompressed. No free or loculated pelvic fluid collections. Scattered areas of increased density demonstrated throughout the bowel and colon likely represent ingested medication. No destructive bone lesions. IMPRESSION: Changes of acute appendicitis. No abscess. 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 Electronically Signed   By: Burman NievesWilliam  Stevens M.D.   On: 04/11/2016 04:09   I have personally reviewed and evaluated these images and lab results as part of my medical decision-making.   EKG Interpretation None     5:57 AM Discussed patient with Dr Donell BeersByerly with General Surgery.  She states that she will come see the patient in the ED. MDM   Final diagnoses:  RLQ abdominal pain   Patient presents today with RLQ abdominal pain onset this morning.  Pain associated with nausea and vomiting.  On exam, she has pain localized to the RLQ and a positive Rovsing's sign.  Labs showing leukocytosis.  CT ab/pelvis ordered to rule out an Acute Appendicitis.  CT showing Acute Appendicitis, no abscess.  Case discussed with Dr Donell BeersByerly with General Surgery who states that she will come see the  patient in the ED.    Santiago GladHeather Donavyn Fecher, PA-C 04/11/16 [id number]  Pricilla LovelessScott Goldston, MD 04/12/16 0300

## 2016-04-11 NOTE — Progress Notes (Signed)
reporty given to [employer] sahaver rn as caregiver

## 2016-04-11 NOTE — Progress Notes (Signed)
Pt. Transport here to transfer pt. To OR. Report called to Short Stay. No questions/complaints. Pt. Transferred to OR.

## 2016-04-11 NOTE — H&P (Signed)
Pamela Morris is an 40 y.o. female.   Chief Complaint: Abdominal pain HPI:  Pt is a 40 yo F who presents with around 18-24 hours of abdominal pain.  She did not feel well when she woke up yesterday and did not eat breakfast.  She started having periumbilical pain around 24-49:75 yesterday. She tried to eat a granola bar with some ginger ale, but she felt nauseated. She tried pepto bismol for nausea, but this did not work.  She was able to get home, and then had significant vomiting with dry heaving.  After she rested a bit she noted that the pain was most severe in the RLQ.  She went to urgent care who transferred her to the hospital.  She denies fever/chills.    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    Past Medical History  Diagnosis Date  . Yeast infection   . Blood type, Rh negative     Past Surgical History  Procedure Laterality Date  . Cesarean section      Family History  Problem Relation Age of Onset  . Peptic Ulcer Father   . Hypertension Mother    Social History:  reports that she has never smoked. She does not have any smokeless tobacco history on file. She reports that she drinks alcohol. Her drug history is not on file.  Allergies: No Known Allergies  Medications:  Mirena  Results for orders placed or performed during the hospital encounter of 04/11/16 (from the past 48 hour(s))  Urinalysis, Routine w reflex microscopic     Status: Abnormal   Collection Time: 04/10/16  9:28 PM  Result Value Ref Range   Color, Urine YELLOW YELLOW   APPearance CLEAR CLEAR   Specific Gravity, Urine 1.025 1.005 - 1.030   pH 5.5 5.0 - 8.0   Glucose, UA NEGATIVE NEGATIVE mg/dL   Hgb urine dipstick LARGE (A) NEGATIVE   Bilirubin Urine NEGATIVE NEGATIVE   Ketones, ur >80 (A) NEGATIVE mg/dL   Protein, ur NEGATIVE NEGATIVE mg/dL   Nitrite NEGATIVE NEGATIVE   Leukocytes, UA NEGATIVE NEGATIVE  Urine microscopic-add on     Status: Abnormal   Collection Time:  04/10/16  9:28 PM  Result Value Ref Range   Squamous Epithelial / LPF 0-5 (A) NONE SEEN   WBC, UA 0-5 0 - 5 WBC/hpf   RBC / HPF 6-30 0 - 5 RBC/hpf   Bacteria, UA RARE (A) NONE SEEN   Urine-Other MUCOUS PRESENT   Lipase, blood     Status: None   Collection Time: 04/10/16  9:40 PM  Result Value Ref Range   Lipase 17 11 - 51 U/L  Comprehensive metabolic panel     Status: Abnormal   Collection Time: 04/10/16  9:40 PM  Result Value Ref Range   Sodium 135 135 - 145 mmol/L   Potassium 3.9 3.5 - 5.1 mmol/L   Chloride 103 101 - 111 mmol/L   CO2 25 22 - 32 mmol/L   Glucose, Bld 126 (H) 65 - 99 mg/dL   BUN 9 6 - 20 mg/dL   Creatinine, Ser 0.67 0.44 - 1.00 mg/dL   Calcium 9.0 8.9 - 10.3 mg/dL   Total Protein 7.2 6.5 - 8.1 g/dL   Albumin 4.3 3.5 - 5.0 g/dL   AST 17 15 - 41 U/L   ALT 14 14 - 54 U/L   Alkaline Phosphatase 62 38 - 126 U/L   Total Bilirubin 0.9 0.3 - 1.2  mg/dL   GFR calc non Af Amer >60 >60 mL/min   GFR calc Af Amer >60 >60 mL/min    Comment: (NOTE) The eGFR has been calculated using the CKD EPI equation. This calculation has not been validated in all clinical situations. eGFR's persistently <60 mL/min signify possible Chronic Kidney Disease.    Anion gap 7 5 - 15  CBC     Status: Abnormal   Collection Time: 04/10/16  9:40 PM  Result Value Ref Range   WBC 23.9 (H) 4.0 - 10.5 K/uL   RBC 4.52 3.87 - 5.11 MIL/uL   Hemoglobin 13.8 12.0 - 15.0 g/dL   HCT 40.4 36.0 - 46.0 %   MCV 89.4 78.0 - 100.0 fL   MCH 30.5 26.0 - 34.0 pg   MCHC 34.2 30.0 - 36.0 g/dL   RDW 12.3 11.5 - 15.5 %   Platelets 288 150 - 400 K/uL  I-Stat Beta hCG blood, ED ([facility], [facility], [facility] only)     Status: None   Collection Time: 04/11/16  2:24 AM  Result Value Ref Range   I-stat hCG, quantitative <5.0 <5 mIU/mL   Comment 3            Comment:   GEST. AGE      CONC.  (mIU/mL)   <=1 WEEK        5 - 50     2 WEEKS       50 - 500     3 WEEKS       100 - 10,000     4 WEEKS     1,000 - 30,000        FEMALE AND  NON-PREGNANT FEMALE:     LESS THAN 5 mIU/mL    Ct Abdomen Pelvis W Contrast  04/11/2016  CLINICAL DATA:  Right lower quadrant pain, nausea, and vomiting since 11 a.m. today. EXAM: CT ABDOMEN AND PELVIS WITH CONTRAST TECHNIQUE: Multidetector CT imaging of the abdomen and pelvis was performed using the standard protocol following bolus administration of intravenous contrast. CONTRAST:  150m ISOVUE-300 IOPAMIDOL (ISOVUE-300) INJECTION 61% COMPARISON:  None. FINDINGS: Mild dependent changes in the lung bases. Bilateral breast implants. Small low-attenuation lesion in the dome of the liver with peripheral enhancement in a nodular pattern suggesting cavernous hemangioma. Gallbladder, pancreas, spleen, adrenal glands, kidneys, abdominal aorta, inferior vena cava, and retroperitoneal lymph nodes are unremarkable. Stomach, small bowel, and colon are not abnormally distended. No free air or free fluid in the abdomen. Small umbilical hernia containing fat. Pelvis: The appendix is distended and thick walled with diameter of about 12 mm. There is periappendiceal edema and infiltration. Changes are consistent with acute appendicitis. No abscess. Uterus and ovaries are not enlarged. Intrauterine device is present. Bladder is decompressed. No free or loculated pelvic fluid collections. Scattered areas of increased density demonstrated throughout the bowel and colon likely represent ingested medication. No destructive bone lesions. IMPRESSION: Changes of acute appendicitis. No abscess. 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 Electronically Signed   By: WLucienne CapersM.D.   On: 04/11/2016 04:09    Review of Systems  Constitutional: Negative.   HENT: Negative.   Eyes: Negative.   Respiratory: Negative.   Cardiovascular: Negative.   Gastrointestinal: Positive for nausea, vomiting, abdominal pain and diarrhea.  Genitourinary: Negative.   Musculoskeletal: Negative.   Skin: Negative.   Neurological:  Negative.   Endo/Heme/Allergies: Negative.   Psychiatric/Behavioral: Negative.     Blood pressure 105/56, pulse 73, temperature  99.1 F (37.3 C), temperature source Oral, resp. rate 16, height '5\' 2"'  (1.575 m), weight 70.563 kg (155 lb 9 oz), SpO2 98 %. Physical Exam  Constitutional: She is oriented to person, place, and time. She appears well-developed and well-nourished. She appears distressed (looks uncomfortable).  HENT:  Head: Normocephalic and atraumatic.  Eyes: Conjunctivae are normal. Pupils are equal, round, and reactive to light. No scleral icterus.  Neck: Neck supple.  Cardiovascular: Normal rate, regular rhythm and intact distal pulses.   Respiratory: Effort normal. No respiratory distress.  GI: Soft. She exhibits no distension. There is tenderness (RLQ). There is guarding (voluntary guarding RLQ). There is no rebound.  Musculoskeletal: Normal range of motion.  Neurological: She is alert and oriented to person, place, and time.  Skin: Skin is warm and dry. She is not diaphoretic.  Psychiatric: She has a normal mood and affect. Her behavior is normal. Judgment and thought content normal.     Assessment/Plan Acute appendicitis NPO IVF IV antibiotics  To OR for laparoscopic appendectomy with Dr. Georganna Skeans. Appendectomy was described to the patient.  The incisions and surgical technique were explained.  The patient was advised that some of the hair on the abdomen would be clipped, and that a foley catheter would be placed.  I advised the patient of the risks of surgery including, but not limited to, bleeding, infection, damage to other structures, risk of an open operation, risk of abscess, and risk of blood clot.  The recovery was also described to the patient.  She was advised that he will have lifting restrictions for 2 weeks.     Stark Klein, MD 04/11/2016, 7:06 AM

## 2016-04-12 ENCOUNTER — Encounter: Payer: Self-pay | Admitting: General Surgery

## 2016-04-12 ENCOUNTER — Encounter (HOSPITAL_COMMUNITY): Payer: Self-pay | Admitting: General Surgery

## 2016-04-12 LAB — CBC
HCT: 32.5 % — ABNORMAL LOW (ref 36.0–46.0)
HEMOGLOBIN: 10.9 g/dL — AB (ref 12.0–15.0)
MCH: 30.3 pg (ref 26.0–34.0)
MCHC: 33.5 g/dL (ref 30.0–36.0)
MCV: 90.3 fL (ref 78.0–100.0)
PLATELETS: 218 10*3/uL (ref 150–400)
RBC: 3.6 MIL/uL — AB (ref 3.87–5.11)
RDW: 12.6 % (ref 11.5–15.5)
WBC: 18.9 10*3/uL — AB (ref 4.0–10.5)

## 2016-04-12 MED ORDER — DOCUSATE SODIUM 100 MG PO CAPS: 100.0000 mg | ORAL_CAPSULE | Freq: Two times a day (BID) | ORAL | Status: DC | PRN

## 2016-04-12 MED ORDER — OXYCODONE HCL 5 MG PO TABS
5.0000 mg | ORAL_TABLET | ORAL | Status: DC | PRN
Start: 2016-04-12 — End: 2016-05-31

## 2016-04-12 MED ORDER — ALUM & MAG HYDROXIDE-SIMETH 200-200-20 MG/5ML PO SUSP: 15.0000 mL | ORAL | Status: DC | PRN

## 2016-04-12 NOTE — Progress Notes (Signed)
[facility] Progress Note  1 Day Post-Op  Subjective: Laying in bed. Pain controlled. +flatus. Urinating without hesitancy. Reports feeling "gassy" and is scared and hesitant to move around or walk to much.    Discussed post-op physical limitations as well as recovery from laparoscopic procedure.  Objective: Vital signs in last 24 hours: Temp:  [97.8 F (36.6 C)-98.6 F (37 C)] 98 F (36.7 C) (07/14 0555) Pulse Rate:  [60-85] 64 (07/14 0555) Resp:  [14-18] 18 (07/13 1725) BP: (100-125)/(46-85) 106/55 mmHg (07/14 0555) SpO2:  [93 %-100 %] 98 % (07/14 0555) Weight:  [70.5 kg (155 lb 6.8 oz)] 70.5 kg (155 lb 6.8 oz) (07/13 0820)    Intake/Output from previous day: 07/13 0701 - 07/14 0700 In: 3117 [P.O.:980; I.V.:2137] Out: 3455 [Urine:3425; Blood:30] Intake/Output this shift:   PE: Gen:  Alert, NAD, pleasant Card:  RRR, no M/G/R heard Pulm:  CTA, no W/R/R Abd: Soft, appropriately tender, +BS, incisions C/D/I  Lab Results:   Recent Labs  04/10/16 2140  WBC 23.9*  HGB 13.8  HCT 40.4  PLT 288   BMET  Recent Labs  04/10/16 2140  NA 135  K 3.9  CL 103  CO2 25  GLUCOSE 126*  BUN 9  CREATININE 0.67  CALCIUM 9.0   PT/INR No results for input(s): LABPROT, INR in the last 72 hours. CMP     Component Value Date/Time   NA 135 04/10/2016 2140   K 3.9 04/10/2016 2140   CL 103 04/10/2016 2140   CO2 25 04/10/2016 2140   GLUCOSE 126* 04/10/2016 2140   BUN 9 04/10/2016 2140   CREATININE 0.67 04/10/2016 2140   CALCIUM 9.0 04/10/2016 2140   PROT 7.2 04/10/2016 2140   ALBUMIN 4.3 04/10/2016 2140   AST 17 04/10/2016 2140   ALT 14 04/10/2016 2140   ALKPHOS 62 04/10/2016 2140   BILITOT 0.9 04/10/2016 2140   GFRNONAA >60 04/10/2016 2140   GFRAA >60 04/10/2016 2140   Lipase     Component Value Date/Time   LIPASE 17 04/10/2016 2140       Studies/Results: Ct Abdomen Pelvis W Contrast  04/11/2016  CLINICAL DATA:  Right lower quadrant pain,  nausea, and vomiting since 11 a.m. today. EXAM: CT ABDOMEN AND PELVIS WITH CONTRAST TECHNIQUE: Multidetector CT imaging of the abdomen and pelvis was performed using the standard protocol following bolus administration of intravenous contrast. CONTRAST:  ISOVUE-300 IOPAMIDOL (ISOVUE-300) INJECTION 61% COMPARISON:  None. FINDINGS: Mild dependent changes in the lung bases. Bilateral breast implants. Small low-attenuation lesion in the dome of the liver with peripheral enhancement in a nodular pattern suggesting cavernous hemangioma. Gallbladder, pancreas, spleen, adrenal glands, kidneys, abdominal aorta, inferior vena cava, and retroperitoneal lymph nodes are unremarkable. Stomach, small bowel, and colon are not abnormally distended. No free air or free fluid in the abdomen. Small umbilical hernia containing fat. Pelvis: The appendix is distended and thick walled with diameter of about 12 mm. There is periappendiceal edema and infiltration. Changes are consistent with acute appendicitis. No abscess. Uterus and ovaries are not enlarged. Intrauterine device is present. Bladder is decompressed. No free or loculated pelvic fluid collections. Scattered areas of increased density demonstrated throughout the bowel and colon likely represent ingested medication. No destructive bone lesions. IMPRESSION: Changes of acute appendicitis. No abscess. 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 Electronically Signed   By: Burman Nieves M.D.   On: 04/11/2016 04:09    Anti-infectives: Anti-infectives    Start  Dose/Rate Route Frequency Ordered Stop   04/11/16 0800  cefTRIAXone (ROCEPHIN) 2 g in dextrose 5 % 50 mL IVPB     2 g 100 mL/hr over 30 Minutes Intravenous Every 24 hours 04/11/16 0628     04/11/16 0800  metroNIDAZOLE (FLAGYL) IVPB 500 mg     500 mg 100 mL/hr over 60 Minutes Intravenous Every 8 hours 04/11/16 [id number]         Assessment/Plan POD#1 laparoscopic appendectomy  FEN: advance diet  as tolerated DVT proph: lovenox Dispo: discharge after tolerance of full liquid/soft diet.      Adam PhenixElizabeth S Simaan , [facility] 04/12/2016, 7:42 AM Pager: [phone] Consults: [phone] Mon-Fri 7:00 am-4:30 pm Sat-Sun 7:00 am-11:30 am

## 2016-04-12 NOTE — Progress Notes (Signed)
Discharged home accompanied by friend.

## 2016-04-12 NOTE — Discharge Summary (Signed)
[facility] Discharge Summary   Patient ID: Pamela RalphKasey C Nylen MRN: [id number] DOB/AGE: August 09, 1976 40 y.o.  Admit date: 04/11/2016 Discharge date: 04/12/2016  Admitting Diagnosis: Acute appendicitis   Discharge Diagnosis Patient Active Problem List   Diagnosis Date Noted  . Acute appendicitis 04/11/2016   Consultants None   Imaging: Ct Abdomen Pelvis W Contrast  04/11/2016  CLINICAL DATA:  Right lower quadrant pain, nausea, and vomiting since 11 a.m. today. EXAM: CT ABDOMEN AND PELVIS WITH CONTRAST TECHNIQUE: Multidetector CT imaging of the abdomen and pelvis was performed using the standard protocol following bolus administration of intravenous contrast. CONTRAST:  100mL ISOVUE-300 IOPAMIDOL (ISOVUE-300) INJECTION 61% COMPARISON:  None. FINDINGS: Mild dependent changes in the lung bases. Bilateral breast implants. Small low-attenuation lesion in the dome of the liver with peripheral enhancement in a nodular pattern suggesting cavernous hemangioma. Gallbladder, pancreas, spleen, adrenal glands, kidneys, abdominal aorta, inferior vena cava, and retroperitoneal lymph nodes are unremarkable. Stomach, small bowel, and colon are not abnormally distended. No free air or free fluid in the abdomen. Small umbilical hernia containing fat. Pelvis: The appendix is distended and thick walled with diameter of about 12 mm. There is periappendiceal edema and infiltration. Changes are consistent with acute appendicitis. No abscess. Uterus and ovaries are not enlarged. Intrauterine device is present. Bladder is decompressed. No free or loculated pelvic fluid collections. Scattered areas of increased density demonstrated throughout the bowel and colon likely represent ingested medication. No destructive bone lesions. IMPRESSION: Changes of acute appendicitis. No abscess. 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 Electronically Signed   By: Burman NievesWilliam  Stevens M.D.   On: 04/11/2016 04:09    Procedures Dr. Violeta GelinasBurke Thompson (04/11/16) - Laparoscopic Appendectomy  Hospital Course:  40 [facility]-24 hours of abdominal pain associated with nausea and vomiting.  Workup showed right lower quadrant tenderness with voluntary guarding.  CT results above. Patient was admitted and underwent procedure listed above.  Tolerated procedure well and was transferred to the floor.  Diet was advanced as tolerated.  On POD#1, the patient was voiding well, tolerating diet, ambulating well, pain well controlled, vital signs stable, incisions c/d/i and felt stable for discharge home.  Patient will follow up in our office in 2 weeks and knows to call with questions or concerns.      Medication List    ASK your doctor about these medications        bismuth subsalicylate 262 MG chewable tablet  Commonly known as:  PEPTO BISMOL  Chew 524 mg by mouth as needed for indigestion or diarrhea or loose stools.     levonorgestrel 20 MCG/24HR IUD  Commonly known as:  MIRENA  1 each by Intrauterine route once.           Follow-up Information    [facility].   Specialty:  General Surgery   Why:  For post-operative follow up. Your appointment is at 10:30 AM, please arrive 15-30 min early to get checked in and fill out any necessary paperwork.   Contact information:   [address] [place] [id number] [phone]      Signed: Hosie Spanglelizabeth Simaan, [facility] 04/12/2016, 10:01 AM Pager: [phone] Consults: [phone] Mon-Fri 7:00 am-4:30 pm Sat-Sun 7:00 am-11:30 am

## 2016-05-30 ENCOUNTER — Telehealth: Payer: Self-pay | Admitting: *Deleted

## 2016-05-30 NOTE — Telephone Encounter (Signed)
Unable to reach patient at time of Pre-Visit Call.  Left message for patient to return call when available.    

## 2016-05-31 ENCOUNTER — Encounter: Payer: Self-pay | Admitting: Family

## 2016-05-31 ENCOUNTER — Ambulatory Visit (INDEPENDENT_AMBULATORY_CARE_PROVIDER_SITE_OTHER): Payer: BLUE CROSS/BLUE SHIELD | Admitting: Family

## 2016-05-31 DIAGNOSIS — F4323 Adjustment disorder with mixed anxiety and depressed mood: Secondary | ICD-10-CM | POA: Diagnosis not present

## 2016-05-31 DIAGNOSIS — Z23 Encounter for immunization: Secondary | ICD-10-CM

## 2016-05-31 MED ORDER — ESCITALOPRAM OXALATE 10 MG PO TABS: ORAL_TABLET | ORAL | 0 refills | Status: DC

## 2016-05-31 NOTE — Progress Notes (Signed)
Pre visit review using our clinic review tool, if applicable. No additional management support is needed unless otherwise documented below in the visit note. 

## 2016-05-31 NOTE — Progress Notes (Signed)
Subjective:    Patient ID: Pamela Morris, female    DOB: 09-Nov-1975, 40 y.o.   MRN: [id number]  HPI   Pamela Morris is a 40 yr old female who presents today to establish care.  Her chief complaint today is anxiety and depression.  Reports that her ex husband left her 3 years ago. She was devastated, began smoking.  Reports that she feels high strung. Reports that she smoking more and drinking more to help with her anxiety.  Reports that over the past 2 months at work if she has an issue come up, she worries about it. Peels her nails off.  Reports that on weekends if she does not have her children she can't get out of bed and has trouble keeping her house clean.  Feels like she can't shut her brain off.  She reports that her mother has a history of anxiety.  She denies SI/HI.     Review of Systems See HPI  Past Medical History:  Diagnosis Date  . Blood type, Rh negative   . Yeast infection      Social History   Social History  . Marital status: Divorced    Spouse name: N/A  . Number of children: N/A  . Years of education: N/A   Occupational History  . Not on file.   Social History Main Topics  . Smoking status: Current Every Day Smoker    Packs/day: 0.25    Years: 2.00    Types: Cigarettes  . Smokeless tobacco: Never Used  . Alcohol use 0.0 oz/week    2 - 3 Standard drinks or equivalent per week  . Drug use: No  . Sexual activity: Yes    Birth control/ protection: IUD     Comment: Mirena X 03/2011   Other Topics Concern  . Not on file   Social History Narrative  . No narrative on file    Past Surgical History:  Procedure Laterality Date  . BREAST ENHANCEMENT SURGERY  07/2014  . CESAREAN SECTION    . LAPAROSCOPIC APPENDECTOMY N/A 04/11/2016   Procedure: APPENDECTOMY LAPAROSCOPIC;  Surgeon: Violeta Gelinas, MD;  Location: [facility] OR;  Service: General;  Laterality: N/A;  . lipoma Left 2007    Family History  Problem Relation Age of Onset  . Peptic Ulcer  Father   . Hypertension Mother   . Aneurysm Mother     No Known Allergies  Current Outpatient Prescriptions on File Prior to Visit  Medication Sig Dispense Refill  . levonorgestrel (MIRENA) 20 MCG/24HR IUD 1 each by Intrauterine route once.     No current facility-administered medications on file prior to visit.     BP 121/78 (BP Location: Right Arm, Cuff Size: Normal)   Pulse 76   Temp 97.8 F (36.6 C) (Oral)   Resp 16   Ht 5\' 2"  (1.575 m)   Wt 152 lb 9.6 oz (69.2 kg)   SpO2 100% Comment: room air  BMI 27.91 kg/m       Objective:   Physical Exam  Constitutional: She is oriented to person, place, and time. She appears well-developed and well-nourished. No distress.  Neurological: She is alert and oriented to person, place, and time.  Psychiatric: Her behavior is normal. Judgment and thought content normal.  Mildly anxious appearing          Assessment & Plan:  30 minutes spent with patient today.  >50% of this time was spent counseling patient on anxiety, depression, smoking cessation  and alcohol use.

## 2016-05-31 NOTE — Assessment & Plan Note (Addendum)
I think she is experiencing anxiety as well as some depression.  We discussed establishing with a counselor ([facility] was [facility]).  I think that once we get her anxiety/depression under control she will better be able to cut down on alcohol and smoking. Will also initiate Lexapro 10mg .  I instructed pt to start 1/2 tablet once daily for 1 week and then increase to a full tablet once daily on week two as tolerated.  We discussed common side effects such as nausea, drowsiness and weight gain.  Also discussed rare but serious side effect of suicide ideation.  She is instructed to discontinue medication go directly to ED if this occurs.  Pt verbalizes understanding.  Plan follow up in 1 month to evaluate progress.    Flu shot today.

## 2016-05-31 NOTE — Patient Instructions (Signed)
Please begin lexapro 10mg , 1/2 tab once daily for 1 week, then increase to a full tab once daily.

## 2016-06-17 ENCOUNTER — Encounter: Payer: Self-pay | Admitting: Family

## 2016-06-28 ENCOUNTER — Ambulatory Visit (INDEPENDENT_AMBULATORY_CARE_PROVIDER_SITE_OTHER): Payer: BLUE CROSS/BLUE SHIELD | Admitting: Family

## 2016-06-28 ENCOUNTER — Encounter: Payer: Self-pay | Admitting: Family

## 2016-06-28 DIAGNOSIS — Z720 Tobacco use: Secondary | ICD-10-CM

## 2016-06-28 DIAGNOSIS — F4323 Adjustment disorder with mixed anxiety and depressed mood: Secondary | ICD-10-CM

## 2016-06-28 MED ORDER — ESCITALOPRAM OXALATE 10 MG PO TABS: ORAL_TABLET | ORAL | 1 refills | Status: DC

## 2016-06-28 NOTE — Progress Notes (Signed)
Pre visit review using our clinic review tool, if applicable. No additional management support is needed unless otherwise documented below in the visit note. 

## 2016-06-28 NOTE — Assessment & Plan Note (Signed)
Improved on lexapro. Continue same.  We discussed scheduling an appointment with a counselor and she plans to do so.

## 2016-06-28 NOTE — Assessment & Plan Note (Signed)
Discussed importance of smoking cessation. She wishes to avoid meds/patch to quit smoking. We discussed possibility of using nicorette gum or lozenges prn.

## 2016-06-28 NOTE — Progress Notes (Addendum)
   Subjective:    Patient ID: Pamela Morris, female    DOB: Mar 24, 1976, 40 y.o.   MRN: [id number]  HPI  Pamela Morris is a 40 yr old female who presents today for follow up of her anxiety and depression. She began lexapro last visit. She reports that she has discontinued alcohol and is sleeping through the night.  Feeling better overall. Her mood is good. She is smoking less.  She smokes 6-8 cig a day. Plans to try to wean off and completely quit smoking soon.  Review of Systems See HPI  Past Medical History:  Diagnosis Date  . Blood type, Rh negative   . Yeast infection      Social History   Social History  . Marital status: Divorced    Spouse name: N/A  . Number of children: N/A  . Years of education: N/A   Occupational History  . Not on file.   Social History Main Topics  . Smoking status: Current Every Day Smoker    Packs/day: 0.25    Years: 2.00    Types: Cigarettes  . Smokeless tobacco: Never Used  . Alcohol use No  . Drug use: No  . Sexual activity: Yes    Birth control/ protection: IUD     Comment: Mirena X 03/2011   Other Topics Concern  . 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 BREAST ENHANCEMENT SURGERY  07/2014  . CESAREAN SECTION    . LAPAROSCOPIC APPENDECTOMY N/A 04/11/2016   Procedure: APPENDECTOMY LAPAROSCOPIC;  Surgeon: Violeta GelinasBurke Thompson, MD;  [facility];  Service: General;  Laterality: N/A;  . lipoma Left 2007    Family History  Problem Relation Age of Onset  . Peptic Ulcer Father   . Hypertension Mother   . Aneurysm Mother     No Known Allergies  Current Outpatient Prescriptions on File Prior to Visit  Medication Sig Dispense Refill  .  levonorgestrel (MIRENA) 20 MCG/24HR IUD 1 each by Intrauterine route once.     No current facility-administered medications on file prior to visit.     BP 121/78 (BP Location: Right Arm, Cuff Size: Normal)   Pulse 62   Temp 98.2 F (36.8 C) (Oral)   Resp 16   Ht 5\' 2"  (1.575 m)   Wt 148 lb 12.8 oz (67.5 kg)   SpO2 99% Comment: room air  BMI 27.22 kg/m       Objective:   Physical Exam  Constitutional: She is oriented to person, place, and time. She appears well-developed and well-nourished.  Neurological: She is alert and oriented to person, place, and time.  Psychiatric: She has a normal mood and affect. Her behavior is normal. Judgment and thought content normal.          Assessment & Plan:  3-5 minutes spent counseling patient on tobacco cessation.

## 2016-06-28 NOTE — Patient Instructions (Signed)
Continue lexapro 10mg  once daily. Work on quitting smoking.

## 2016-09-06 ENCOUNTER — Ambulatory Visit: Payer: BLUE CROSS/BLUE SHIELD | Admitting: Family

## 2016-12-18 LAB — HM PAP SMEAR: HM Pap smear: NORMAL

## 2016-12-18 LAB — HM MAMMOGRAPHY: HM MAMMO: NORMAL (ref 0–4)

## 2016-12-30 LAB — HM PAP SMEAR: HM Pap smear: NORMAL

## 2017-01-21 ENCOUNTER — Other Ambulatory Visit: Payer: Self-pay | Admitting: Family

## 2017-01-21 NOTE — Telephone Encounter (Signed)
30 day supply of Lexapro sent to pharmacy with note that pt needs to be seen for further refills. Last OV was 05/2016 and she was advised to f/u in 3 months. Pt "no showed" last appt. Mychart message sent to pt.

## 2017-02-10 ENCOUNTER — Encounter: Payer: Self-pay | Admitting: Family

## 2017-02-10 ENCOUNTER — Ambulatory Visit (INDEPENDENT_AMBULATORY_CARE_PROVIDER_SITE_OTHER): Payer: BLUE CROSS/BLUE SHIELD | Admitting: Family

## 2017-02-10 DIAGNOSIS — F4323 Adjustment disorder with mixed anxiety and depressed mood: Secondary | ICD-10-CM | POA: Diagnosis not present

## 2017-02-10 DIAGNOSIS — Z720 Tobacco use: Secondary | ICD-10-CM

## 2017-02-10 NOTE — Progress Notes (Signed)
Pre visit review using our clinic review tool, if applicable. No additional management support is needed unless otherwise documented below in the visit note. 

## 2017-02-10 NOTE — Assessment & Plan Note (Signed)
Stable off of lexapro. She does not wish to start any other medications at this time. I still think that she would benefit from counseling, but she has trouble finding time in her busy schedule for this.  Monitor.

## 2017-02-10 NOTE — Assessment & Plan Note (Signed)
We discussed smoking cessation and specifically using the nicotine patch- beginning at the 14 mcg dose OTC.

## 2017-02-10 NOTE — Progress Notes (Signed)
Subjective:    Patient ID: Pamela Morris, female    DOB: [birth year], 40 y.o.   MRN: [id number]  HPI   Ms. Dunkley is a 41 yr old female who presents today for follow up.  Depression/anxiety- we last saw her back in 9/17.  At that time she described improved mood on lexapro, sleeping better and smoking less.  Reports that I the first week of march she went several days without medication. Felt like she was better able to feel emotion off of the medication which she liked. Reports that she feels less stressed overall.  Coping better.  Not turning to alcohol like she was.  Only drinking socially on the weekends.   Tobacco abuse- went 3 weeks in April without smoking, the she had a bad day and starting.  She is smoking 1 Pack in 1.5 days. Motivated to quit.  Wt Readings from Last 3 Encounters:  02/10/17 154 lb 12.8 oz (70.2 kg)  06/28/16 148 lb 12.8 oz (67.5 kg)  05/31/16 152 lb 9.6 oz (69.2 kg)    Review of Systems See HPI  Past Medical History:  Diagnosis Date  . Blood type, Rh negative   . Yeast infection      Social History   Social History  . Marital status: Divorced    Spouse name: N/A  . Number of children: N/A  . Years of education: N/A   Occupational History  . Not on file.   Social History Main Topics  . Smoking status: Current Every Day Smoker    Packs/day: 0.25    Years: 2.00    Types: Cigarettes  . Smokeless tobacco: Never Used  . Alcohol use No  . Drug use: No  . Sexual activity: Yes    Birth control/ protection: IUD     Comment: Mirena X 03/2011   Other Topics Concern  . 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 BREAST ENHANCEMENT SURGERY  07/2014  . CESAREAN  SECTION    . LAPAROSCOPIC APPENDECTOMY N/A 04/11/2016   Procedure: APPENDECTOMY LAPAROSCOPIC;  Surgeon: Violeta Gelinas, MD;  Location: [facility] OR;  Service: General;  Laterality: N/A;  . lipoma Left 2007    Family History  Problem Relation Age of Onset  . Peptic Ulcer Father   . Hypertension Mother   . Aneurysm Mother     Allergies  Allergen Reactions  . Lexapro [Escitalopram Oxalate] Other (See Comments)    LACK OF EMOTION    Current Outpatient Prescriptions on File Prior to Visit  Medication Sig Dispense Refill  . levonorgestrel (MIRENA) 20 MCG/24HR IUD 1 each by Intrauterine route once.     No current facility-administered medications on file prior to visit.     BP 115/76 (BP Location: Right Arm, Cuff Size: Normal)   Pulse 71   Temp 98.7 F (37.1 C) (Oral)   Resp 16   Ht 5\' 2"  (1.575 m)   Wt 154 lb 12.8 oz (70.2 kg)   SpO2 98%   BMI 28.31 kg/m       Objective:   Physical Exam  Constitutional: She is oriented to person, place, and time. She appears well-developed and well-nourished.  No distress.  Neurological: She is alert and oriented to person, place, and time.  Psychiatric: She has a normal mood and affect. Her behavior is normal. Judgment normal.          Assessment & Plan:  20 min spent with patient. >50% of this time was spent counseling patient on anxiety/depression and tobacco cessation.

## 2017-02-10 NOTE — Patient Instructions (Signed)
Try the Nicotine patch, starting at 14 mcg/day dosing.  Consider scheduling with a counselor.

## 2017-06-06 ENCOUNTER — Encounter: Payer: BLUE CROSS/BLUE SHIELD | Admitting: Family

## 2017-08-15 IMAGING — CT CT ABD-PELV W/ CM
2 of 5 series · 16 of 46 positions shown, 18 images · IV contrast (Omni 300)
Comparison: None.

CLINICAL DATA: Right lower quadrant pain, nausea, and vomiting
since 11 a.m. today.

EXAM:
CT ABDOMEN AND PELVIS WITH CONTRAST
TECHNIQUE: Multidetector CT imaging of the abdomen and pelvis was performed
using the standard protocol following bolus administration of
intravenous contrast.
CONTRAST:  100mL GVLPYT-PAA IOPAMIDOL (GVLPYT-PAA) INJECTION 61%

[Series 2: a/p w/ 5mm · axial · 0.69mm/px · z∈[-463,-48]mm · 13 of 95 slices shown, 15 images]
[im 6/95  soft-tissue]
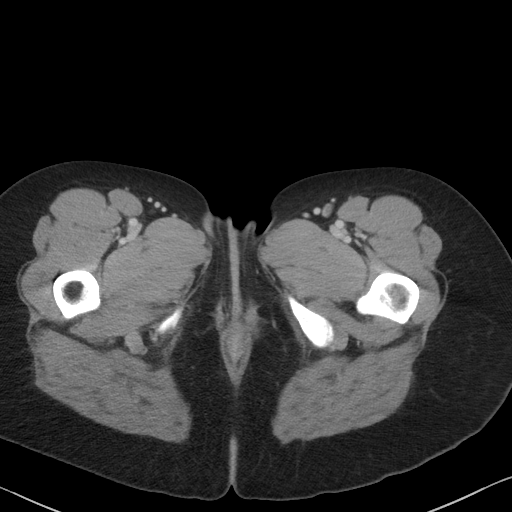
[im 6/95  bone]
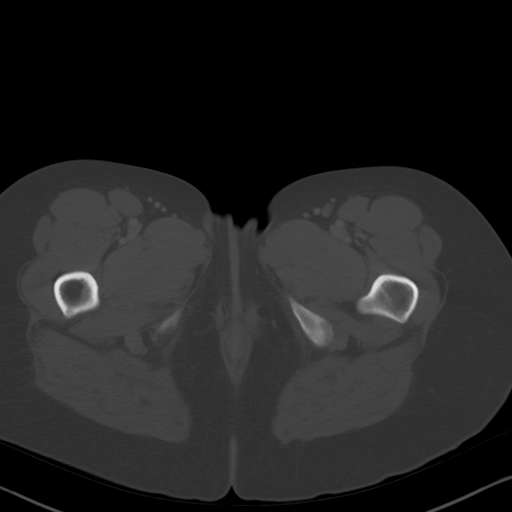
[im 11/95  soft-tissue]
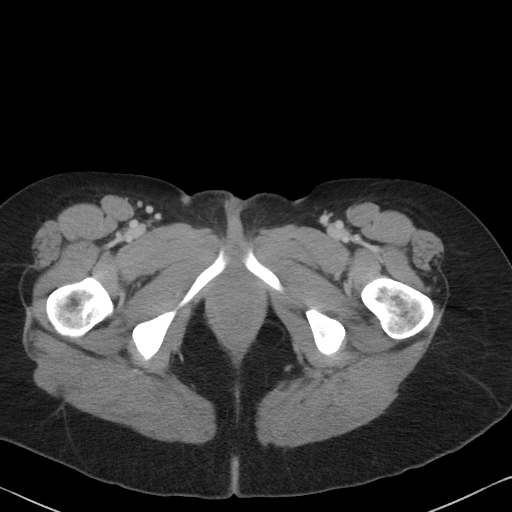
[im 21/95  soft-tissue]
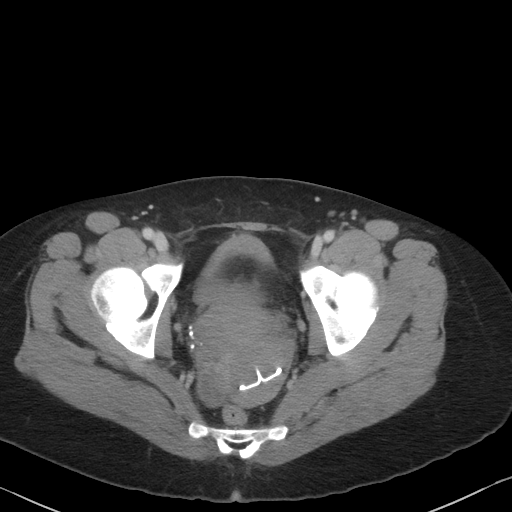
[im 27/95  soft-tissue]
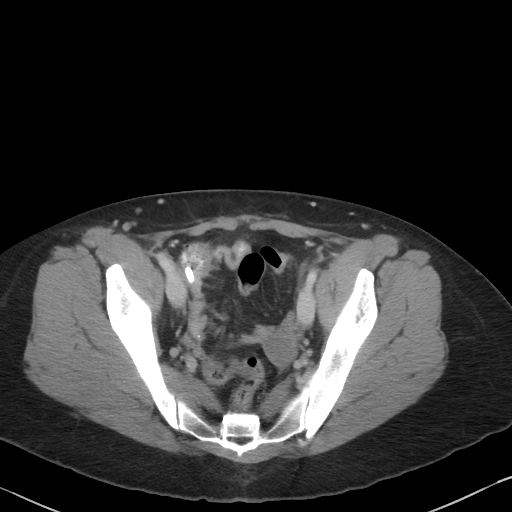
[im 32/95  soft-tissue]
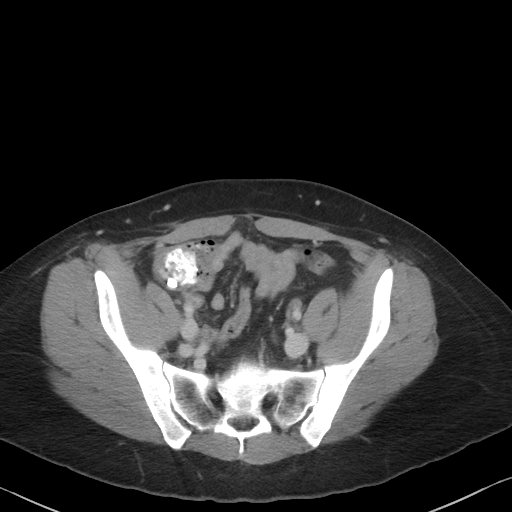
[im 42/95  soft-tissue]
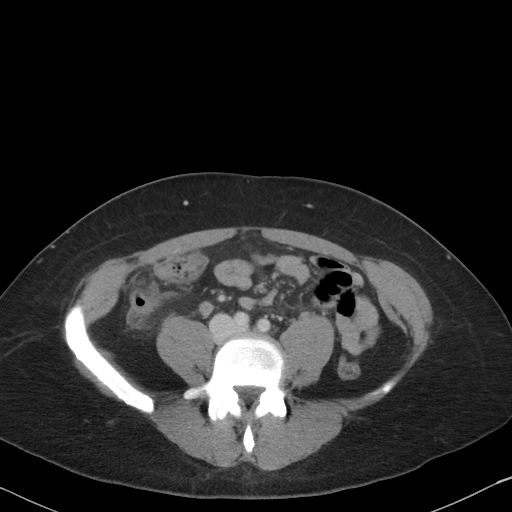
[im 48/95  soft-tissue]
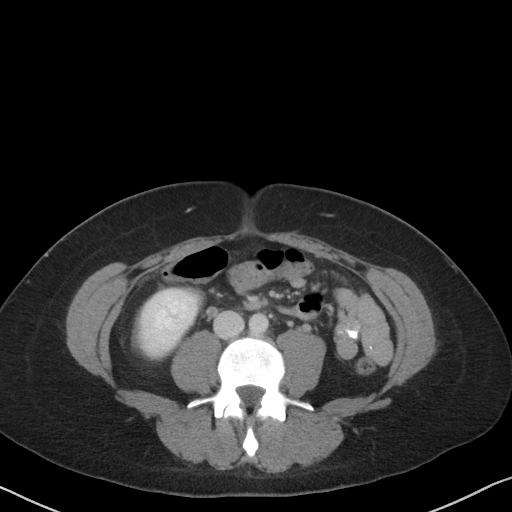
[im 53/95  soft-tissue]
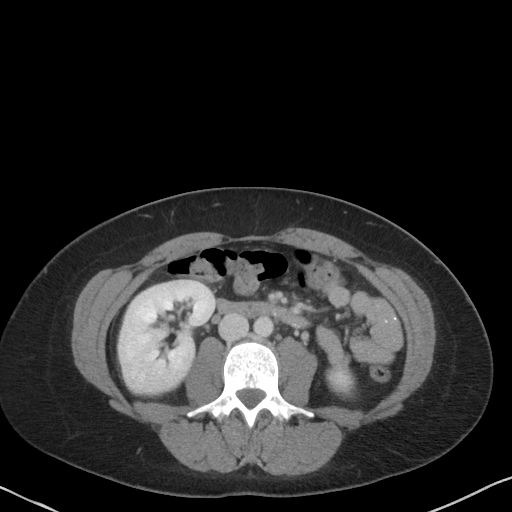
[im 63/95  soft-tissue]
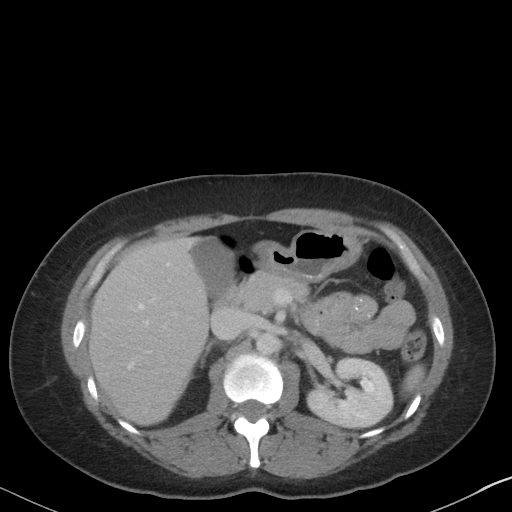
[im 63/95  bone]
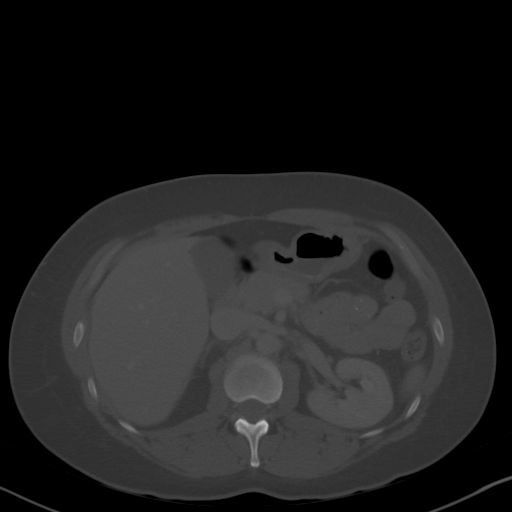
[im 68/95  soft-tissue]
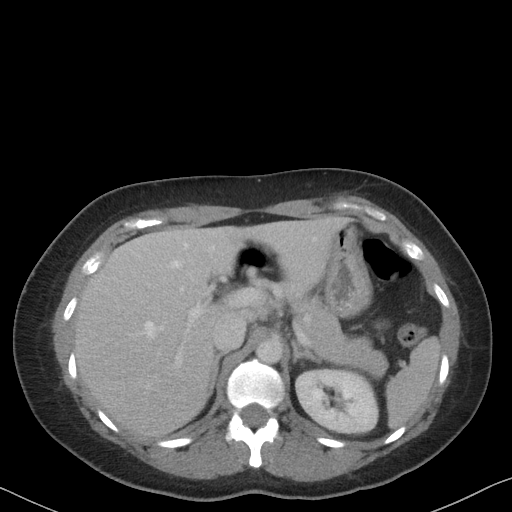
[im 74/95  soft-tissue]
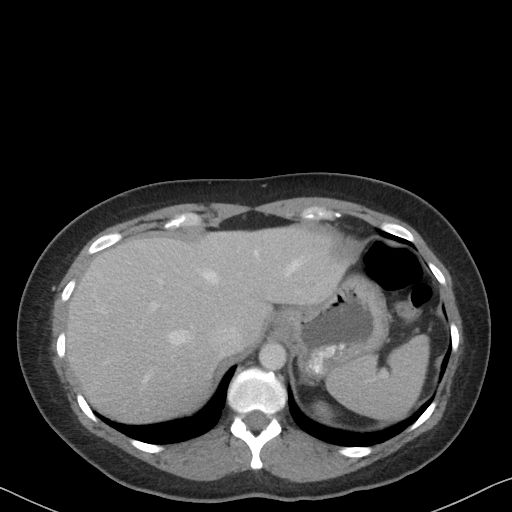
[im 84/95  soft-tissue]
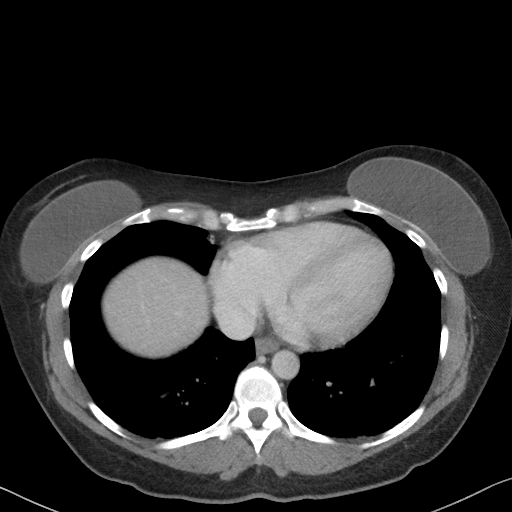
[im 89/95  soft-tissue]
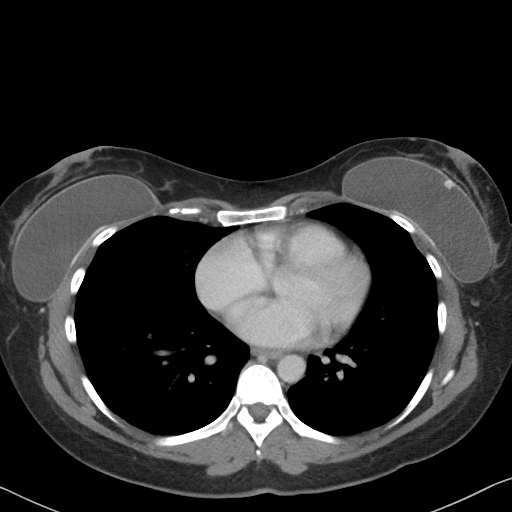

[Series 5: a/p w/ cor · coronal · 0.77mm/px · 3 of 101 slices shown]
[im 34/101  soft-tissue]
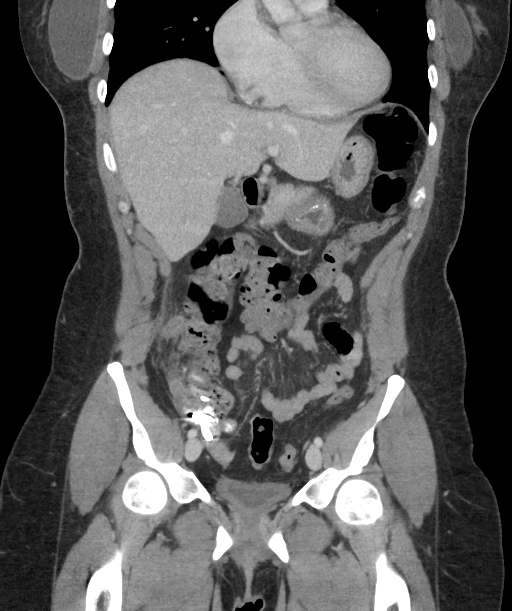
[im 45/101  soft-tissue]
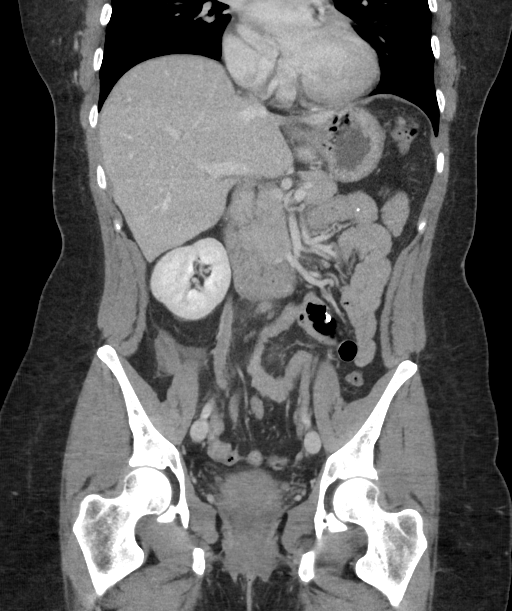
[im 56/101  soft-tissue]
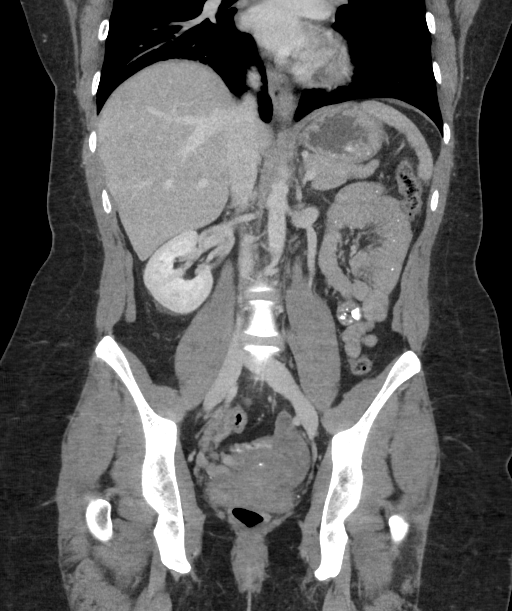

[16 of 46 positions shown; findings below may reference images not displayed]

FINDINGS: Mild dependent changes in the lung bases. Bilateral breast implants.

Small low-attenuation lesion in the dome of the liver with
peripheral enhancement in a nodular pattern suggesting cavernous
hemangioma. Gallbladder, pancreas, spleen, adrenal glands, kidneys,
abdominal aorta, inferior vena cava, and retroperitoneal lymph nodes
are unremarkable. Stomach, small bowel, and colon are not abnormally
distended. No free air or free fluid in the abdomen. Small umbilical
hernia containing fat.

Pelvis: The appendix is distended and thick walled with diameter of
about 12 mm. There is periappendiceal edema and infiltration.
Changes are consistent with acute appendicitis. No abscess. Uterus
and ovaries are not enlarged. Intrauterine device is present.
Bladder is decompressed. No free or loculated pelvic fluid
collections. Scattered areas of increased density demonstrated
throughout the bowel and colon likely represent ingested medication.
No destructive bone lesions.
IMPRESSION: Changes of acute appendicitis. No abscess. Small cavernous
hemangioma in the dome of the liver.

## 2018-06-02 ENCOUNTER — Ambulatory Visit: Payer: PRIVATE HEALTH INSURANCE | Admitting: Family

## 2018-06-02 ENCOUNTER — Encounter: Payer: Self-pay | Admitting: Family

## 2018-06-02 VITALS — BP 138/84 | HR 91 | Temp 98.1°F | Resp 18 | Ht 62.0 in | Wt 162.0 lb

## 2018-06-02 DIAGNOSIS — F418 Other specified anxiety disorders: Secondary | ICD-10-CM | POA: Diagnosis not present

## 2018-06-02 DIAGNOSIS — F1010 Alcohol abuse, uncomplicated: Secondary | ICD-10-CM | POA: Diagnosis not present

## 2018-06-02 MED ORDER — ALPRAZOLAM 0.25 MG PO TABS: 0.2500 mg | ORAL_TABLET | Freq: Two times a day (BID) | ORAL | 0 refills | Status: DC | PRN

## 2018-06-02 MED ORDER — VENLAFAXINE HCL ER 37.5 MG PO CP24
ORAL_CAPSULE | ORAL | 0 refills | Status: DC
Start: 2018-06-02 — End: 2018-06-29

## 2018-06-02 NOTE — Patient Instructions (Signed)
Please begin effexor one tab once daily for 3 days, then increase to two tabs once daily. Please schedule an appointment with a counselor. Consider attending an AA meeting for support.  You may use xanax as needed for severe panic attack, do not mix with alcohol.

## 2018-06-02 NOTE — Progress Notes (Signed)
Subjective:    Patient ID: Pamela Morris, female    DOB: 21-Jan-1976, 42 y.o.   MRN: [id number]  HPI  Patient is a 42 yr old female who presents today for follow up of her anxiety and depression.  Reports that she is going through a breakup, car having problems, doesn't want to get out of bed.   Reports that she can have a lot of anxiety at work.  Reports that she started taking the lexapro and it helps for about a week.  Feels depressed.  Drinks to help her anxiety.  Reports that she was given a valium twice in the last two months.  Drinking vodka- no drink yesterday, no withdrawal since Thursday.  Sunday- one beer and Saturday 2 beers.    Reports that about 3 weeks ago she was drinking so much that when she tried she had withdrawal symptoms. She has tried to taper herself down on her alcohol and reports that her last withdrawal symptoms were on 05/28/18.  She denies thoughts of hurting herself although she notes that she has had feelings of despair and hopelessness when she has been intoxicated.  Denies any suicidal plan.  Wt Readings from Last 3 Encounters:  06/02/18 162 lb (73.5 kg)  02/10/17 154 lb 12.8 oz (70.2 kg)  06/28/16 148 lb 12.8 oz (67.5 kg)    Review of Systems See HPI  Past Medical History:  Diagnosis Date  . Blood type, Rh negative   . Yeast infection      Social History   Socioeconomic History  . Marital status: Divorced    Spouse name: Not on file  . Number of children: Not on file  . Years of education: Not on file  . Highest education level: Not on file  Occupational History  . Not on file  Social Needs  . Financial resource strain: Not on file  . Food insecurity:    Worry: Not on file    Inability: Not on file  . Transportation needs:    Medical: Not on file    Non-medical: Not on file  Tobacco Use  . Smoking status: Current Every Day Smoker    Packs/day: 0.25    Years: 2.00    Pack years: 0.50    Types: Cigarettes  . Smokeless tobacco:  Never Used  Substance and Sexual Activity  . Alcohol use: No  . Drug use: No  . Sexual activity: Yes    Birth control/protection: IUD    Comment: Mirena X 03/2011  Lifestyle  . Physical activity:    Days per week: Not on file    Minutes per session: Not on file  . Stress: Not on file  Relationships  . Social connections:    Talks on phone: Not on file    Gets together: Not on file    Attends religious service: Not on file    Active member of club or organization: Not on file    Attends meetings of clubs or organizations: Not on file    Relationship status: Not on file  . Intimate partner violence:    Fear of current or ex partner: Not on file    Emotionally abused: Not on file    Physically abused: Not on file    Forced sexual activity: Not on file  Other Topics Concern  . 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 BREAST ENHANCEMENT SURGERY  07/2014  . CESAREAN SECTION    . LAPAROSCOPIC APPENDECTOMY N/A 04/11/2016   Procedure: APPENDECTOMY LAPAROSCOPIC;  Surgeon: Violeta Gelinas, MD;  Location: [facility] OR;  Service: General;  Laterality: N/A;  . lipoma Left 2007    Family History  Problem Relation Age of Onset  . Peptic Ulcer Father   . Hypertension Mother   . Aneurysm Mother     Allergies  Allergen Reactions  . Lexapro [Escitalopram Oxalate] Other (See Comments)    LACK OF EMOTION    Current Outpatient Medications on File Prior to Visit  Medication Sig Dispense Refill  . levonorgestrel (MIRENA) 20 MCG/24HR IUD 1 each by Intrauterine route once.    . valACYclovir (VALTREX) 500 MG tablet Take 1 tablet by mouth daily as needed.     No current facility-administered medications on file prior to  visit.     BP (!) 138/95 (BP Location: Right Arm, Cuff Size: Normal)   Pulse 91   Temp 98.1 F (36.7 C) (Oral)   Resp 18   Ht 5\' 2"  (1.575 m)   Wt 162 lb (73.5 kg)   SpO2 100%   BMI 29.63 kg/m       Objective:   Physical Exam  Constitutional: She is oriented to person, place, and time. She appears well-developed and well-nourished.  Musculoskeletal: She exhibits no edema.  Neurological: She is alert and oriented to person, place, and time.  Skin: Skin is warm and dry.  Psychiatric: Her behavior is normal. Judgment and thought content normal.  tearful          Assessment & Plan:  Alcohol abuse-it appears that she has been self-medicating for her anxiety and depression.  We discussed having her establish with a counselor as well as attending a local AA meeting.  Hopefully when her depression and anxiety are under better control this will also improve for her.  Depression/anxiety-uncontrolled.  She did not like the way that she felt on Lexapro.  We will give trial of Effexor.  I have advised the patient to go to the ER and call 911 should she develop thoughts of hurting herself or others and she verbalizes understanding.  I have also given her a one-time prescription for Xanax to use as needed for panic attacks.  It is my hope that once we get her anxiety and depression symptoms under better control that we will not need to continue the Xanax.  I also advised her not to mix Xanax with alcohol and she understands this.   A total of 25  minutes were spent face-to-face with the patient during this encounter and over half of that time was spent on counseling and coordination of care. The patient was counseled on depression/anxiety/alcohol abuse.

## 2018-06-03 ENCOUNTER — Encounter: Payer: Self-pay | Admitting: Family

## 2018-06-29 ENCOUNTER — Ambulatory Visit: Payer: PRIVATE HEALTH INSURANCE | Admitting: Family

## 2018-06-29 VITALS — BP 128/90 | HR 92 | Temp 98.1°F | Resp 16 | Ht 62.0 in | Wt 159.0 lb

## 2018-06-29 DIAGNOSIS — F418 Other specified anxiety disorders: Secondary | ICD-10-CM | POA: Diagnosis not present

## 2018-06-29 MED ORDER — VENLAFAXINE HCL ER 150 MG PO CP24: 150.0000 mg | ORAL_CAPSULE | Freq: Every day | ORAL | 1 refills | Status: DC

## 2018-06-29 NOTE — Progress Notes (Signed)
Subjective:    Patient ID: Pamela Morris, female    DOB: [birth year], 42 y.o.   MRN: [id number]  HPI  Pamela Morris is a 42 yr old female who presents today for follow up.  Depression/Anxiety- last visit she noted issues with alcohol abuse, depression and anxiety. We gave her a trial of effexor and as needed xanax.  Reports that 3 days on the medication and she felt "at peace and I could breath."  Reports that the last 1.5 weeks she has been getting more anxious again. Reports that she has been having difficulty sleeping.  Appetite has been low. Reports she has had some alcohol but mostly in social settings.  Using xanax sparingly.   Wt Readings from Last 3 Encounters:  06/29/18 159 lb (72.1 kg)  06/02/18 162 lb (73.5 kg)  02/10/17 154 lb 12.8 oz (70.2 kg)      Review of Systems   See HPI  Past Medical History:  Diagnosis Date  . Blood type, Rh negative   . Yeast infection      Social History   Socioeconomic History  . Marital status: Divorced    Spouse name: Not on file  . Number of children: Not on file  . Years of education: Not on file  . Highest education level: Not on file  Occupational History  . Not on file  Social Needs  . Financial resource strain: Not on file  . Food insecurity:    Worry: Not on file    Inability: Not on file  . Transportation needs:    Medical: Not on file    Non-medical: Not on file  Tobacco Use  . Smoking status: Current Every Day Smoker    Packs/day: 0.25    Years: 2.00    Pack years: 0.50    Types: Cigarettes  . Smokeless tobacco: Never Used  Substance and Sexual Activity  . Alcohol use: No  . Drug use: No  . Sexual activity: Yes    Birth control/protection: IUD    Comment: Mirena X 03/2011  Lifestyle  . Physical activity:    Days per week: Not on file    Minutes per session: Not on file  . Stress: Not on file  Relationships  . Social connections:    Talks on phone: Not on file    Gets together: Not on file   Attends religious service: Not on file    Active member of club or organization: Not on file    Attends meetings of clubs or organizations: Not on file    Relationship status: Not on file  . Intimate partner violence:    Fear of current or ex partner: Not on file    Emotionally abused: Not on file    Physically abused: Not on file    Forced sexual activity: Not on file  Other Topics Concern  . 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 BREAST ENHANCEMENT SURGERY  07/2014  . CESAREAN SECTION    . LAPAROSCOPIC APPENDECTOMY N/A 04/11/2016   Procedure: APPENDECTOMY LAPAROSCOPIC;  Surgeon: Violeta Gelinas, MD;  Location: [facility] OR;  Service: General;  Laterality: N/A;  . lipoma Left 2007  Family History  Problem Relation Age of Onset  . Peptic Ulcer Father   . Hypertension Mother   . Aneurysm Mother     Allergies  Allergen Reactions  . Lexapro [Escitalopram Oxalate] Other (See Comments)    LACK OF EMOTION    Current Outpatient Medications on File Prior to Visit  Medication Sig Dispense Refill  . ALPRAZolam (XANAX) 0.25 MG tablet Take 1 tablet (0.25 mg total) by mouth 2 (two) times daily as needed for anxiety. 20 tablet 0  . levonorgestrel (MIRENA) 20 MCG/24HR IUD 1 each by Intrauterine route once.    . valACYclovir (VALTREX) 500 MG tablet Take 1 tablet by mouth daily as needed.     No current facility-administered medications on file prior to visit.     BP 128/90 (BP Location: Right Arm, Patient Position: Sitting, Cuff Size: Small)   Pulse 92   Temp 98.1 F (36.7 C) (Oral)   Resp 16   Ht 5\' 2"  (1.575 m)   Wt 159 lb (72.1 kg)   SpO2 100%   BMI 29.08 kg/m        Objective:   Physical Exam    Constitutional: She is oriented to person, place, and time. She appears well-developed and well-nourished.  Neurological: She is alert and oriented to person, place, and time.  Psychiatric: Her behavior is normal. Judgment and thought content normal.  Moderately anxious appearing          Assessment & Plan:  Depression/anxiety- PHQ-9 improved from 16 to 10. Will increase effexor to 150mg  once daily.  Continue melatonin HS for sleep.  Encouraged her to establish with a counselor.  Will watch weight. I typically don't see weight loss on effexor, appetite issues may be due to her depression- monitor. Continue prn xanax. We discussed if she needs long term we will need a controlled substance contract.  She will think about this and we will re-assess next visit.  A total of 15  minutes were spent face-to-face with the patient during this encounter and over half of that time was spent on counseling and coordination of care. The patient was counseled on anxiety/depression.

## 2018-06-29 NOTE — Patient Instructions (Signed)
Please increase effexor to 150mg  once daily. Continue melatonin prior to bedtime.

## 2018-07-24 ENCOUNTER — Ambulatory Visit: Payer: PRIVATE HEALTH INSURANCE | Admitting: Family

## 2018-07-29 ENCOUNTER — Encounter: Payer: Self-pay | Admitting: Family

## 2018-07-30 NOTE — Progress Notes (Signed)
Subjective:    Patient ID: Pamela Morris, female    DOB: 03-18-1976, 42 y.o.   MRN: [id number]  HPI  Patient is a 42 year old female who presents today for follow-up of anxiety and depression.  We last saw her on June 29, 2018.  At that time she has noted that she had had a brief improvement with the initiation of Effexor and as needed Xanax.  However several weeks after starting she developed increased anxiety.  Weight was down 3 pounds from a visit previously.  Alcohol use had decreased from heavy to social settings only.  We increased her Effexor from 75 mg to 150 mg once daily.  We also encouraged her to establish with a counselor. She reports that this adjustment keeps me pretty level but still has breakthrough episodes. Would like to keep xanax on hand.  Feels like it helps her keep from drinking.    Reports that she was hot sweaty on Wednesday in the middle of th night. Felt likeher neck was  Reports that her HR went up to 139 on her smart watch.  She went several days without eating.  Had whiteness on her tongue.  Took a diflucan that she had on hand.  Reports that her uvula is swollen.  HR has been normal now that she is eating and drinking better.  Today she feels "the best I have all week."  Appetite has been poor for the last 2 months. Smoking more recently. Did drink alcohol this last weekend.     Wt Readings from Last 3 Encounters:  07/31/18 160 lb 3.2 oz (72.7 kg)  06/29/18 159 lb (72.1 kg)  06/02/18 162 lb (73.5 kg)    Review of Systems See hpi  Past Medical History:  Diagnosis Date  . Blood type, Rh negative   . Yeast infection      Social History   Socioeconomic History  . Marital status: Divorced    Spouse name: Not on file  . Number of children: Not on file  . Years of education: Not on file  . Highest education level: Not on file  Occupational History  . Not on file  Social Needs  . Financial resource strain: Not on file  . Food insecurity:   Worry: Not on file    Inability: Not on file  . Transportation needs:    Medical: Not on file    Non-medical: Not on file  Tobacco Use  . Smoking status: Current Every Day Smoker    Packs/day: 0.25    Years: 2.00    Pack years: 0.50    Types: Cigarettes  . Smokeless tobacco: Never Used  Substance and Sexual Activity  . Alcohol use: No  . Drug use: No  . Sexual activity: Yes    Birth control/protection: IUD    Comment: Mirena X 03/2011  Lifestyle  . Physical activity:    Days per week: Not on file    Minutes per session: Not on file  . Stress: Not on file  Relationships  . Social connections:    Talks on phone: Not on file    Gets together: Not on file    Attends religious service: Not on file    Active member of club or organization: Not on file    Attends meetings of clubs or organizations: Not on file    Relationship status: Not on file  . Intimate partner violence:    Fear of current or ex partner: Not on file  Emotionally abused: Not on file    Physically abused: Not on file    Forced sexual activity: Not on file  Other Topics Concern  . 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 BREAST ENHANCEMENT SURGERY  07/2014  . CESAREAN SECTION    . LAPAROSCOPIC APPENDECTOMY N/A 04/11/2016   Procedure: APPENDECTOMY LAPAROSCOPIC;  Surgeon: Violeta Gelinas, MD;  [facility];  Service: General;  Laterality: N/A;  . lipoma Left 2007    Family History  Problem Relation Age of Onset  . Peptic Ulcer Father   . Hypertension Mother   . Aneurysm Mother     Allergies  Allergen Reactions  . Lexapro [Escitalopram Oxalate] Other (See Comments)    LACK OF EMOTION    Current Outpatient Medications  on File Prior to Visit  Medication Sig Dispense Refill  . ALPRAZolam (XANAX) 0.25 MG tablet Take 1 tablet (0.25 mg total) by mouth 2 (two) times daily as needed for anxiety. 20 tablet 0  . levonorgestrel (MIRENA) 20 MCG/24HR IUD 1 each by Intrauterine route once.    . valACYclovir (VALTREX) 500 MG tablet Take 1 tablet by mouth daily as needed.    . venlafaxine XR (EFFEXOR-XR) 150 MG 24 hr capsule Take 1 capsule (150 mg total) by mouth daily with breakfast. 30 capsule 1   No current facility-administered medications on file prior to visit.     BP (!) 137/92 (BP Location: Right Arm, Cuff Size: Normal)   Pulse 91   Temp 98.1 F (36.7 C) (Oral)   Ht 5\' 2"  (1.575 m)   Wt 160 lb 3.2 oz (72.7 kg)   BMI 29.30 kg/m       Objective:   Physical Exam  Constitutional: She is oriented to person, place, and time. She appears well-developed and well-nourished.  HENT:  Right Ear: Tympanic membrane and ear canal normal.  Left Ear: Tympanic membrane and ear canal normal.  Mild erythema/petechiae noted on posterior oropharynx.  Uvula is mildly enlarged.  No tonsillar exudates or obvious thrush is noted.  Neck:  Mild cervical lymphadenopathy is noted.  Cardiovascular: Normal rate, regular rhythm and normal heart sounds.  No murmur heard. Pulmonary/Chest: Effort normal and breath sounds normal. No respiratory distress. She has no wheezes.  Musculoskeletal: She exhibits no edema.  Neurological: She is alert and oriented to person, place, and time.  Psychiatric: She has a normal mood and affect. Her behavior is normal. Judgment and thought content normal.          Assessment & Plan:  Viral pharyngitis- rapid strep is negative.  We discussed supportive measures including hydration and as needed ibuprofen.  She is advised to call if symptoms worsen or fail to improve.  Palpitations- I suspect that this was related to viral illness and dehydration.  EKG is performed today and notes normal sinus  rhythm without acute changes.  Rate is stable.  Will obtain complete metabolic panel, CBC, and TSH to further evaluate.  Depression/anxiety-overall this seems to be improving.  She would like to keep her prescription of Xanax on hand and we will initiate urine drug screening and a controlled substance contract today in order to continue this  on an as-needed basis.  A refill is sent to her pharmacy for Xanax.  We will continue Effexor at current dosing.

## 2018-07-31 ENCOUNTER — Ambulatory Visit: Payer: PRIVATE HEALTH INSURANCE | Admitting: Family

## 2018-07-31 ENCOUNTER — Telehealth: Payer: Self-pay | Admitting: Family

## 2018-07-31 ENCOUNTER — Encounter: Payer: Self-pay | Admitting: Family

## 2018-07-31 VITALS — BP 137/92 | HR 91 | Temp 98.1°F | Ht 62.0 in | Wt 160.2 lb

## 2018-07-31 DIAGNOSIS — R002 Palpitations: Secondary | ICD-10-CM

## 2018-07-31 DIAGNOSIS — F418 Other specified anxiety disorders: Secondary | ICD-10-CM

## 2018-07-31 DIAGNOSIS — R945 Abnormal results of liver function studies: Secondary | ICD-10-CM

## 2018-07-31 DIAGNOSIS — R7989 Other specified abnormal findings of blood chemistry: Secondary | ICD-10-CM

## 2018-07-31 DIAGNOSIS — F419 Anxiety disorder, unspecified: Secondary | ICD-10-CM

## 2018-07-31 DIAGNOSIS — J029 Acute pharyngitis, unspecified: Secondary | ICD-10-CM | POA: Diagnosis not present

## 2018-07-31 LAB — CBC WITH DIFFERENTIAL/PLATELET
BASOS ABS: 0 10*3/uL (ref 0.0–0.1)
Basophils Relative: 0.8 % (ref 0.0–3.0)
EOS PCT: 3.6 % (ref 0.0–5.0)
Eosinophils Absolute: 0.2 10*3/uL (ref 0.0–0.7)
HEMATOCRIT: 39.6 % (ref 36.0–46.0)
Hemoglobin: 13.5 g/dL (ref 12.0–15.0)
LYMPHS PCT: 21.7 % (ref 12.0–46.0)
Lymphs Abs: 1.2 10*3/uL (ref 0.7–4.0)
MCHC: 34.1 g/dL (ref 30.0–36.0)
MCV: 94.5 fl (ref 78.0–100.0)
MONOS PCT: 12.6 % — AB (ref 3.0–12.0)
Monocytes Absolute: 0.7 10*3/uL (ref 0.1–1.0)
NEUTROS ABS: 3.5 10*3/uL (ref 1.4–7.7)
Neutrophils Relative %: 61.3 % (ref 43.0–77.0)
Platelets: 228 10*3/uL (ref 150.0–400.0)
RBC: 4.19 Mil/uL (ref 3.87–5.11)
RDW: 12.8 % (ref 11.5–15.5)
WBC: 5.7 10*3/uL (ref 4.0–10.5)

## 2018-07-31 LAB — COMPREHENSIVE METABOLIC PANEL
ALT: 295 U/L — ABNORMAL HIGH (ref 0–35)
AST: 306 U/L — ABNORMAL HIGH (ref 0–37)
Albumin: 4.4 g/dL (ref 3.5–5.2)
Alkaline Phosphatase: 74 U/L (ref 39–117)
BUN: 7 mg/dL (ref 6–23)
CHLORIDE: 101 meq/L (ref 96–112)
CO2: 29 mEq/L (ref 19–32)
Calcium: 8.8 mg/dL (ref 8.4–10.5)
Creatinine, Ser: 0.74 mg/dL (ref 0.40–1.20)
GFR: 90.96 mL/min (ref >60.00)
GLUCOSE: 96 mg/dL (ref 70–99)
POTASSIUM: 4 meq/L (ref 3.5–5.1)
SODIUM: 139 meq/L (ref 135–145)
Total Bilirubin: 0.7 mg/dL (ref 0.2–1.2)
Total Protein: 6.9 g/dL (ref 6.0–8.3)

## 2018-07-31 LAB — POCT RAPID STREP A (OFFICE): Rapid Strep A Screen: NEGATIVE

## 2018-07-31 LAB — TSH: TSH: 3.18 u[IU]/mL (ref 0.35–4.50)

## 2018-07-31 MED ORDER — ALPRAZOLAM 0.25 MG PO TABS: 0.2500 mg | ORAL_TABLET | Freq: Two times a day (BID) | ORAL | 0 refills | Status: DC | PRN

## 2018-07-31 NOTE — Patient Instructions (Signed)
Please complete lab work prior to leaving. Make sure you eat 3 well balanced meals a day and that you drink plenty of water. Call if fever, if sore throat worsens or if it fails to improve.

## 2018-07-31 NOTE — Telephone Encounter (Signed)
Left message for pt to return my call. Ok for [facility] / Triage to discuss with pt and schedule lab appt and give pt radiology # [phone]) to call and schedule her ultrasound.

## 2018-07-31 NOTE — Telephone Encounter (Signed)
Pt returned call and lab message from her pcp given to her with verbal understanding. Lab appointment scheduled for next Friday 08/07/18. Pt states that she had this hepatitis test done at her OB-GYN in June of this year and will try to get those results to M. Lendell Caprice, NP.  So if she still needs to do the test please let her know. Also she received a call from radiology and she is scheduled to have the ultrasound in the morning at 9:30 am.

## 2018-07-31 NOTE — Telephone Encounter (Signed)
Notes. Yes she should still repeat the hepatitis testing.

## 2018-07-31 NOTE — Telephone Encounter (Signed)
Please contact patient and let her know that I have reviewed her lab work.  Her liver function tests are quite high.  Since she has not been feeling well recently I would like for her to complete an abdominal ultrasound to look at her liver and gallbladder.  I would also like her to complete some hepatitis blood tests.  She should avoid alcohol and follow-up with me in 2 weeks.

## 2018-08-01 ENCOUNTER — Ambulatory Visit (HOSPITAL_BASED_OUTPATIENT_CLINIC_OR_DEPARTMENT_OTHER)
Admission: RE | Admit: 2018-08-01 | Discharge: 2018-08-01 | Disposition: A | Discharge status: Home / Self Care | Payer: PRIVATE HEALTH INSURANCE | Source: Ambulatory Visit | Attending: Family | Admitting: Family

## 2018-08-01 DIAGNOSIS — R160 Hepatomegaly, not elsewhere classified: Secondary | ICD-10-CM | POA: Diagnosis not present

## 2018-08-01 DIAGNOSIS — R945 Abnormal results of liver function studies: Secondary | ICD-10-CM | POA: Insufficient documentation

## 2018-08-02 ENCOUNTER — Encounter: Payer: Self-pay | Admitting: Family

## 2018-08-02 LAB — PAIN MGMT, PROFILE 8 W/CONF, U
6 Acetylmorphine: NEGATIVE ng/mL (ref <10)
ALCOHOL METABOLITES: POSITIVE ng/mL — AB (ref <500)
AMPHETAMINES: NEGATIVE ng/mL (ref <500)
BENZODIAZEPINES: NEGATIVE ng/mL (ref <100)
BUPRENORPHINE, URINE: NEGATIVE ng/mL (ref <5)
COCAINE METABOLITE: NEGATIVE ng/mL (ref <150)
CREATININE: 108.6 mg/dL
ETHYL SULFATE (ETS): 1630 ng/mL — AB (ref <100)
Ethyl Glucuronide (ETG): 10051 ng/mL — ABNORMAL HIGH (ref <500)
MDMA: NEGATIVE ng/mL (ref <500)
Marijuana Metabolite: NEGATIVE ng/mL (ref <20)
OXIDANT: NEGATIVE ug/mL (ref <200)
Opiates: NEGATIVE ng/mL (ref <100)
Oxycodone: NEGATIVE ng/mL (ref <100)
pH: 6.62 (ref 4.5–9.0)

## 2018-08-03 NOTE — Telephone Encounter (Signed)
Notified pt and she voices understanding. Needs to r/s current lab appt. Appt r/s for 08/11/18 at 7am.

## 2018-08-07 ENCOUNTER — Other Ambulatory Visit: Payer: PRIVATE HEALTH INSURANCE

## 2018-08-11 ENCOUNTER — Other Ambulatory Visit: Payer: PRIVATE HEALTH INSURANCE

## 2018-08-25 ENCOUNTER — Other Ambulatory Visit: Payer: Self-pay | Admitting: Family

## 2018-09-07 ENCOUNTER — Other Ambulatory Visit: Payer: Self-pay | Admitting: Family

## 2018-09-09 MED ORDER — ALPRAZOLAM 0.25 MG PO TABS: ORAL_TABLET | ORAL | 0 refills | Status: DC

## 2018-09-09 NOTE — Telephone Encounter (Signed)
Last RX: 07-31-18  Last OV:07-31-18 Next OV:11-02-2018 UDS:07-31-18 CSC:07-31-18 CSR:

## 2018-09-09 NOTE — Addendum Note (Signed)
Addended by: Sandford Craze'SULLIVAN, Kendrick Remigio on: 09/09/2018 01:32 PM   Modules accepted: Orders

## 2018-09-21 ENCOUNTER — Other Ambulatory Visit: Payer: Self-pay | Admitting: Family

## 2018-11-02 ENCOUNTER — Ambulatory Visit: Payer: PRIVATE HEALTH INSURANCE | Admitting: Family

## 2018-11-23 ENCOUNTER — Encounter: Payer: Self-pay | Admitting: Family

## 2018-11-27 ENCOUNTER — Other Ambulatory Visit: Payer: Self-pay | Admitting: Family

## 2018-11-27 NOTE — Telephone Encounter (Signed)
Last RX:  09/09/2018 30 tabs Last OV: 07/31/18 Next OV: None scheduled UDS  07/31/18 : CSC:  07/31/18 CSR:  No discrepancies.

## 2018-12-28 ENCOUNTER — Telehealth: Payer: Self-pay

## 2018-12-28 NOTE — Telephone Encounter (Signed)
Let's contact pt to schedule a virtual visit tomorrow please.

## 2018-12-28 NOTE — Telephone Encounter (Signed)
12/28/18  Received [facility] notes from 12/25/18 on today. Follow up call made to  patient/boyfriend. Boyfriend states patient has not had drink in a few days and has not seen anyone yet. States they will call back when patient has occurrence of blacking out. Advised patient may need to call if she is having issues with drinking for referral. Advised she may need to be taken to ED if she has Syncopal episode. Patients boyfriend agreed.

## 2018-12-28 NOTE — Telephone Encounter (Signed)
Patient scheduled for tomorrow morning  

## 2018-12-29 ENCOUNTER — Other Ambulatory Visit: Payer: Self-pay

## 2018-12-29 ENCOUNTER — Encounter: Payer: Self-pay | Admitting: Family

## 2018-12-29 ENCOUNTER — Ambulatory Visit (INDEPENDENT_AMBULATORY_CARE_PROVIDER_SITE_OTHER): Payer: 59 | Admitting: Family

## 2018-12-29 DIAGNOSIS — F1010 Alcohol abuse, uncomplicated: Secondary | ICD-10-CM

## 2018-12-29 DIAGNOSIS — F418 Other specified anxiety disorders: Secondary | ICD-10-CM

## 2018-12-29 MED ORDER — ALPRAZOLAM 0.25 MG PO TABS: ORAL_TABLET | ORAL | 0 refills | Status: DC

## 2018-12-29 MED ORDER — VENLAFAXINE HCL ER 150 MG PO CP24: ORAL_CAPSULE | ORAL | 1 refills | Status: DC

## 2018-12-29 MED FILL — VENLAFAXINE HCL ER 150 MG C: 150 | 90 days supply | Qty: 90 | Fill #0

## 2018-12-29 MED FILL — ALPRAZolam 0.25 MG TABS: 0.25 | 15 days supply | Qty: 30 | Fill #0

## 2018-12-29 NOTE — Progress Notes (Signed)
Virtual Visit via Video Note  I connected with@ on 12/29/18 at  8:40 AM EDT by a video enabled telemedicine application and verified that I am speaking with the correct person using two identifiers. We are conducting virtual visits due to Covid 19 pandemic.    I discussed the limitations of evaluation and management by telemedicine and the availability of in person appointments. The patient expressed understanding and agreed to proceed.  Only the patient and myself were on today's video visit. The patient was at home and I was in my office at the time of today's visit.   History of Present Illness:  Hx of Alcohol abuse-  Reports that she had been doing AA for a while.  Now due to covid pandemic she is unable to attend these meetings. Reports that she left her old job which was a stressor. She is now working for [employer] which she is very happy about.  Reports that she has trouble shutting down at night.  Reports increased motivation and tending to her home. Reports that her life has been better in the last few months.  Reports that she has off/on issues with insomnia. Uses melatonin prn which helps.  Reports that she went out of town 3/12-15.  Started out with wine "casually drinking on a daily basis." Then started drinking liquor "until I passed out." Last drink was Friday 3/27.  Nothing since.  She reports that she felt sick on Saturday or Sunday had nausea and did not want to eat.   Otherwise no withdrawal symptoms.   Observations/Objective:   Gen: Awake, alert, no acute distress Resp: Breathing is even and non-labored Psych: calm/pleasant demeanor Neuro: Alert and Oriented x 3, + facial symmetry, speech is clear.  Assessment and Plan:  Alcohol abuse- I advised her to reach out to AA to see if they are offering any video platforms as AA really seemed to help her. Encouraged continued abstinence.  Depression/anxiety- mood is stable, advised pt to continue effexor. OK to use xanax prn  anxiety but advised pt not to mix xanax with alcohol.  Follow Up Instructions:    I discussed the assessment and treatment plan with the patient. The patient was provided an opportunity to ask questions and all were answered. The patient agreed with the plan and demonstrated an understanding of the instructions.   The patient was advised to call back or seek an in-person evaluation if the symptoms worsen or if the condition fails to improve as anticipated.    A total of 15  minutes were spent with the patient during this encounter and over half of that time was spent on counseling and coordination of care. The patient was counseled on alcohol abuse, abstinence, depression/anxiety t.   Lemont Fillers, NP

## 2019-01-27 ENCOUNTER — Other Ambulatory Visit: Payer: Self-pay | Admitting: Family

## 2019-01-27 NOTE — Telephone Encounter (Signed)
Last RX: 12-28-18 #30 Last OV: 12-29-18 virtual visit Next OV: None scheduled UDS:  07-31-18 CSC:  08-05-18 CSR:  No discrepansies

## 2019-01-28 MED FILL — ALPRAZolam 0.25 MG TABS: 0.25 | 15 days supply | Qty: 30 | Fill #0

## 2019-02-27 DIAGNOSIS — H524 Presbyopia: Secondary | ICD-10-CM | POA: Diagnosis not present

## 2019-03-02 DIAGNOSIS — Z1231 Encounter for screening mammogram for malignant neoplasm of breast: Secondary | ICD-10-CM | POA: Diagnosis not present

## 2019-03-02 DIAGNOSIS — Z01419 Encounter for gynecological examination (general) (routine) without abnormal findings: Secondary | ICD-10-CM | POA: Diagnosis not present

## 2019-03-02 DIAGNOSIS — Z113 Encounter for screening for infections with a predominantly sexual mode of transmission: Secondary | ICD-10-CM | POA: Diagnosis not present

## 2019-03-02 DIAGNOSIS — Z3040 Encounter for surveillance of contraceptives, unspecified: Secondary | ICD-10-CM | POA: Diagnosis not present

## 2019-03-02 DIAGNOSIS — N898 Other specified noninflammatory disorders of vagina: Secondary | ICD-10-CM | POA: Diagnosis not present

## 2019-03-02 DIAGNOSIS — Z6830 Body mass index (BMI) 30.0-30.9, adult: Secondary | ICD-10-CM | POA: Diagnosis not present

## 2019-03-12 ENCOUNTER — Other Ambulatory Visit: Payer: Self-pay | Admitting: Family

## 2019-03-12 MED FILL — ALPRAZolam 0.25 MG TABS: 0.25 | 15 days supply | Qty: 30 | Fill #0

## 2019-04-21 ENCOUNTER — Other Ambulatory Visit: Payer: Self-pay | Admitting: Family

## 2019-04-22 MED FILL — ALPRAZolam 0.25 MG TABS: 0.25 | 15 days supply | Qty: 30 | Fill #0

## 2019-04-26 MED FILL — VALACYCLOVIR HCL 500 MG TAB: 500 | 30 days supply | Qty: 30 | Fill #0

## 2019-06-09 ENCOUNTER — Other Ambulatory Visit: Payer: Self-pay | Admitting: Family

## 2019-06-09 MED FILL — ALPRAZolam 0.25 MG TABS: 0.25 | 15 days supply | Qty: 30 | Fill #0

## 2019-06-25 MED FILL — VALACYCLOVIR HCL 500 MG TAB: 500 | 30 days supply | Qty: 30 | Fill #1

## 2019-07-15 ENCOUNTER — Other Ambulatory Visit: Payer: Self-pay | Admitting: Family

## 2019-07-15 NOTE — Telephone Encounter (Signed)
Last alprazolam RX: 06/09/19, #30 Last OV: 12/29/18 Next OV: none scheduled UDS: 07/31/18, past due CSC: 07/31/18, past due

## 2019-07-16 MED FILL — ALPRAZolam 0.25 MG TABS: 0.25 | 15 days supply | Qty: 30 | Fill #0

## 2019-08-04 DIAGNOSIS — Z1231 Encounter for screening mammogram for malignant neoplasm of breast: Secondary | ICD-10-CM | POA: Diagnosis not present

## 2019-08-04 LAB — HM MAMMOGRAPHY

## 2019-08-12 ENCOUNTER — Other Ambulatory Visit: Payer: Self-pay | Admitting: Family

## 2019-08-12 DIAGNOSIS — F419 Anxiety disorder, unspecified: Secondary | ICD-10-CM

## 2019-08-12 NOTE — Telephone Encounter (Signed)
Let's bring her in for a follow up appointment prior to refill.

## 2019-08-13 NOTE — Telephone Encounter (Signed)
Lvm for patient to be aware she needs ov and to call for appointment or schedule on her mychart.

## 2019-08-18 ENCOUNTER — Ambulatory Visit (INDEPENDENT_AMBULATORY_CARE_PROVIDER_SITE_OTHER): Payer: 59 | Admitting: Family

## 2019-08-18 ENCOUNTER — Other Ambulatory Visit: Payer: Self-pay

## 2019-08-18 DIAGNOSIS — F1011 Alcohol abuse, in remission: Secondary | ICD-10-CM | POA: Diagnosis not present

## 2019-08-18 DIAGNOSIS — F32A Depression, unspecified: Secondary | ICD-10-CM

## 2019-08-18 DIAGNOSIS — F329 Major depressive disorder, single episode, unspecified: Secondary | ICD-10-CM

## 2019-08-18 DIAGNOSIS — F419 Anxiety disorder, unspecified: Secondary | ICD-10-CM

## 2019-08-18 MED ORDER — ALPRAZOLAM 0.25 MG PO TABS: 0.2500 mg | ORAL_TABLET | Freq: Two times a day (BID) | ORAL | 0 refills | Status: DC | PRN

## 2019-08-18 MED FILL — ALPRAZolam 0.25 MG TABS: 0.25 | 15 days supply | Qty: 30 | Fill #0

## 2019-08-18 NOTE — Progress Notes (Signed)
Virtual Visit via Video Note  I connected with Warrick Parisian on 08/18/19 at  9:00 AM EST by a video enabled telemedicine application and verified that I am speaking with the correct person using two identifiers.  Location: Patient: home Provider: work   I discussed the limitations of evaluation and management by telemedicine and the availability of in person appointments. The patient expressed understanding and agreed to proceed.  History of Present Illness:  Patient is a 43 yr old female who presents today for follow up of her anxiety and depression. Reports that she has weaned herself off of effexor. She has been off of effexor since April.  States that she didn't feel herself.   She is now working from home. Attending AA.  >30 days alcohol free.  Using the xanax prn anxiety and as needed for sleep. She is exercising every morning walking in the neighborhood.    Past Medical History:  Diagnosis Date  . Blood type, Rh negative   . Yeast infection      Social History   Socioeconomic History  . Marital status: Divorced    Spouse name: Not on file  . Number of children: Not on file  . Years of education: Not on file  . Highest education level: Not on file  Occupational History  . Not on file  Social Needs  . Financial resource strain: Not on file  . Food insecurity    Worry: Not on file    Inability: Not on file  . Transportation needs    Medical: Not on file    Non-medical: Not on file  Tobacco Use  . Smoking status: Current Every Day Smoker    Packs/day: 0.25    Years: 2.00    Pack years: 0.50    Types: Cigarettes  . Smokeless tobacco: Never Used  Substance and Sexual Activity  . Alcohol use: No  . Drug use: No  . Sexual activity: Yes    Birth control/protection: I.U.D.    Comment: Mirena X 03/2011  Lifestyle  . Physical activity    Days per week: Not on file    Minutes per session: Not on file  . Stress: Not on file  Relationships  . Social Product manager on phone: Not on file    Gets together: Not on file    Attends religious service: Not on file    Active member of club or organization: Not on file    Attends meetings of clubs or organizations: Not on file    Relationship status: Not on file  . Intimate partner violence    Fear of current or ex partner: Not on file    Emotionally abused: Not on file    Physically abused: Not on file    Forced sexual activity: Not on file  Other Topics Concern  . 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 BREAST ENHANCEMENT SURGERY  07/2014  . CESAREAN SECTION    . LAPAROSCOPIC APPENDECTOMY N/A 04/11/2016   Procedure: APPENDECTOMY LAPAROSCOPIC;  Surgeon: Georganna Skeans, MD;  Location: [facility];  Service: General;  Laterality: N/A;  . lipoma Left 2007  Family History  Problem Relation Age of Onset  . Peptic Ulcer Father   . Hypertension Mother   . Aneurysm Mother     Allergies  Allergen Reactions  . Lexapro [Escitalopram Oxalate] Other (See Comments)    LACK OF EMOTION    Current Outpatient Medications on File Prior to Visit  Medication Sig Dispense Refill  . levonorgestrel (MIRENA) 20 MCG/24HR IUD 1 each by Intrauterine route once.    . valACYclovir (VALTREX) 500 MG tablet Take 1 tablet by mouth daily as needed.     No current facility-administered medications on file prior to visit.     There were no vitals taken for this visit.   Observations/Objective:   Gen: Awake, alert, no acute distress Resp: Breathing is even and non-labored Psych: calm/pleasant demeanor Neuro: Alert and Oriented x 3, + facial symmetry, speech is clear.   Assessment and Plan:  Depression/anxiety- stable off of effexor,  using xanax prn.  She is due for a controlled substance contract/UDS and I have asked her to schedule a lab visit to complete these.  Hx of ETOH abuse- now abstinent for 30 days.  Continues to work with AA.  I commended her on this and encouraged her to keep up the good work.   Follow Up Instructions:    I discussed the assessment and treatment plan with the patient. The patient was provided an opportunity to ask questions and all were answered. The patient agreed with the plan and demonstrated an understanding of the instructions.   The patient was advised to call back or seek an in-person evaluation if the symptoms worsen or if the condition fails to improve as anticipated.  Lemont Fillers, NP

## 2019-08-30 ENCOUNTER — Other Ambulatory Visit: Payer: Self-pay

## 2019-08-30 ENCOUNTER — Encounter: Payer: Self-pay | Admitting: Family

## 2019-08-30 DIAGNOSIS — F419 Anxiety disorder, unspecified: Secondary | ICD-10-CM

## 2019-08-30 DIAGNOSIS — F329 Major depressive disorder, single episode, unspecified: Secondary | ICD-10-CM

## 2019-08-30 DIAGNOSIS — F32A Depression, unspecified: Secondary | ICD-10-CM

## 2019-08-30 NOTE — Telephone Encounter (Signed)
Called patient back and scheduled for UDS on 09-09-2019

## 2019-09-01 DIAGNOSIS — Z30433 Encounter for removal and reinsertion of intrauterine contraceptive device: Secondary | ICD-10-CM | POA: Diagnosis not present

## 2019-09-01 DIAGNOSIS — Z30430 Encounter for insertion of intrauterine contraceptive device: Secondary | ICD-10-CM | POA: Diagnosis not present

## 2019-09-02 ENCOUNTER — Other Ambulatory Visit: Payer: 59

## 2019-09-09 ENCOUNTER — Other Ambulatory Visit: Payer: Self-pay

## 2019-09-09 ENCOUNTER — Other Ambulatory Visit (INDEPENDENT_AMBULATORY_CARE_PROVIDER_SITE_OTHER): Payer: 59

## 2019-09-09 DIAGNOSIS — F419 Anxiety disorder, unspecified: Secondary | ICD-10-CM | POA: Diagnosis not present

## 2019-09-09 DIAGNOSIS — F329 Major depressive disorder, single episode, unspecified: Secondary | ICD-10-CM

## 2019-09-09 DIAGNOSIS — F32A Depression, unspecified: Secondary | ICD-10-CM

## 2019-09-11 LAB — PAIN MGMT, PROFILE 8 W/CONF, U
6 Acetylmorphine: NEGATIVE ng/mL
Alcohol Metabolites: NEGATIVE ng/mL (ref <500)
Alphahydroxyalprazolam: NEGATIVE ng/mL
Alphahydroxymidazolam: NEGATIVE ng/mL
Alphahydroxytriazolam: NEGATIVE ng/mL
Aminoclonazepam: NEGATIVE ng/mL
Amphetamines: NEGATIVE ng/mL
Benzodiazepines: NEGATIVE ng/mL
Buprenorphine, Urine: NEGATIVE ng/mL
Cocaine Metabolite: NEGATIVE ng/mL
Creatinine: 68.7 mg/dL
Hydroxyethylflurazepam: NEGATIVE ng/mL
Lorazepam: NEGATIVE ng/mL
MDMA: NEGATIVE ng/mL
Marijuana Metabolite: NEGATIVE ng/mL
Nordiazepam: NEGATIVE ng/mL
Opiates: NEGATIVE ng/mL
Oxazepam: NEGATIVE ng/mL
Oxidant: NEGATIVE ug/mL
Oxycodone: NEGATIVE ng/mL
Temazepam: NEGATIVE ng/mL
pH: 5.4 (ref 4.5–9.0)

## 2019-09-21 ENCOUNTER — Other Ambulatory Visit: Payer: Self-pay | Admitting: Family

## 2019-09-22 MED FILL — ALPRAZolam 0.25 MG TABS: 0.25 | 15 days supply | Qty: 30 | Fill #0

## 2019-10-25 MED FILL — VALACYCLOVIR HCL 500 MG TAB: 500 | 30 days supply | Qty: 30 | Fill #0

## 2019-11-01 ENCOUNTER — Other Ambulatory Visit: Payer: Self-pay | Admitting: Family

## 2019-11-01 NOTE — Telephone Encounter (Signed)
Last RX:09/21/2020 Last OV:08/18/2019 Next WG:NFAO scheduled UDS:09/09/2019 CSC:08/05/2018 CSR:no discrepansies

## 2019-11-01 NOTE — Telephone Encounter (Signed)
Refill sent, but she is due to update contract. Please leave copy of contract at front desk and ask pt to come by to sign at her earliest convenience.

## 2019-11-02 MED FILL — ALPRAZolam 0.25 MG TABS: 0.25 | 15 days supply | Qty: 30 | Fill #0

## 2019-11-02 NOTE — Telephone Encounter (Signed)
Per patient, she signed contract at the time of UDS on 09-09-2019.  Is probably in the process of getting scanned in to the system.

## 2019-12-08 ENCOUNTER — Other Ambulatory Visit: Payer: Self-pay | Admitting: Family

## 2019-12-09 MED FILL — ALPRAZolam 0.25 MG TABS: 0.25 | 15 days supply | Qty: 30 | Fill #0

## 2020-01-10 ENCOUNTER — Telehealth: Payer: Self-pay | Admitting: Family

## 2020-01-10 MED FILL — ALPRAZolam 0.25 MG TABS: 0.25 | 15 days supply | Qty: 30 | Fill #0

## 2020-01-10 NOTE — Telephone Encounter (Signed)
Requesting:xanax Contract:yes UDS:low risk  Last OV:08/18/19 Next OV:n/a Last Refill:12/09/19 #[id number]  Please advise

## 2020-02-12 DIAGNOSIS — F10239 Alcohol dependence with withdrawal, unspecified: Secondary | ICD-10-CM | POA: Diagnosis not present

## 2020-02-12 DIAGNOSIS — F1020 Alcohol dependence, uncomplicated: Secondary | ICD-10-CM | POA: Diagnosis not present

## 2020-02-13 DIAGNOSIS — F10239 Alcohol dependence with withdrawal, unspecified: Secondary | ICD-10-CM | POA: Diagnosis not present

## 2020-02-18 DIAGNOSIS — F1020 Alcohol dependence, uncomplicated: Secondary | ICD-10-CM | POA: Diagnosis not present

## 2020-03-03 DIAGNOSIS — F1020 Alcohol dependence, uncomplicated: Secondary | ICD-10-CM | POA: Diagnosis not present

## 2020-03-04 DIAGNOSIS — F1020 Alcohol dependence, uncomplicated: Secondary | ICD-10-CM | POA: Diagnosis not present

## 2020-03-05 DIAGNOSIS — F1020 Alcohol dependence, uncomplicated: Secondary | ICD-10-CM | POA: Diagnosis not present

## 2020-03-06 DIAGNOSIS — F1020 Alcohol dependence, uncomplicated: Secondary | ICD-10-CM | POA: Diagnosis not present

## 2020-03-07 DIAGNOSIS — F1020 Alcohol dependence, uncomplicated: Secondary | ICD-10-CM | POA: Diagnosis not present

## 2020-03-08 DIAGNOSIS — F1020 Alcohol dependence, uncomplicated: Secondary | ICD-10-CM | POA: Diagnosis not present

## 2020-03-09 DIAGNOSIS — F1020 Alcohol dependence, uncomplicated: Secondary | ICD-10-CM | POA: Diagnosis not present

## 2020-03-10 DIAGNOSIS — F1020 Alcohol dependence, uncomplicated: Secondary | ICD-10-CM | POA: Diagnosis not present

## 2020-03-11 DIAGNOSIS — F1020 Alcohol dependence, uncomplicated: Secondary | ICD-10-CM | POA: Diagnosis not present

## 2020-03-14 DIAGNOSIS — H524 Presbyopia: Secondary | ICD-10-CM | POA: Diagnosis not present

## 2020-03-15 MED FILL — DESVENLAFAXINE SUCCINATE ER: 25 | 30 days supply | Qty: 30 | Fill #0

## 2020-03-20 ENCOUNTER — Other Ambulatory Visit: Payer: Self-pay | Admitting: Family

## 2020-03-21 MED FILL — ALPRAZolam 0.25 MG TABS: 0.25 | 15 days supply | Qty: 30 | Fill #0

## 2020-04-05 ENCOUNTER — Other Ambulatory Visit: Payer: Self-pay

## 2020-04-05 ENCOUNTER — Ambulatory Visit: Payer: 59 | Admitting: Family

## 2020-04-05 ENCOUNTER — Encounter: Payer: Self-pay | Admitting: Family

## 2020-04-05 ENCOUNTER — Other Ambulatory Visit: Payer: Self-pay | Admitting: Family

## 2020-04-05 VITALS — BP 110/62 | HR 78 | Resp 16 | Ht 62.0 in | Wt 165.0 lb

## 2020-04-05 DIAGNOSIS — G4700 Insomnia, unspecified: Secondary | ICD-10-CM

## 2020-04-05 DIAGNOSIS — F419 Anxiety disorder, unspecified: Secondary | ICD-10-CM | POA: Diagnosis not present

## 2020-04-05 DIAGNOSIS — F1011 Alcohol abuse, in remission: Secondary | ICD-10-CM | POA: Diagnosis not present

## 2020-04-05 MED ORDER — TRAZODONE HCL 50 MG PO TABS: 25.0000 mg | ORAL_TABLET | Freq: Every evening | ORAL | 3 refills | Status: DC | PRN

## 2020-04-05 MED ORDER — ALPRAZOLAM 0.5 MG PO TABS: 0.5000 mg | ORAL_TABLET | Freq: Two times a day (BID) | ORAL | 0 refills | Status: DC | PRN

## 2020-04-05 MED ORDER — DESVENLAFAXINE SUCCINATE ER 25 MG PO TB24: 1.0000 | ORAL_TABLET | Freq: Every day | ORAL | 1 refills | Status: DC

## 2020-04-05 MED FILL — ALPRAZolam 0.5 MG TABS: 0.5 | 15 days supply | Qty: 30 | Fill #0

## 2020-04-05 MED FILL — traZODone HCL 50 MG TABS: 50 | 30 days supply | Qty: 30 | Fill #0

## 2020-04-05 NOTE — Progress Notes (Signed)
Subjective:    Patient ID: Pamela Morris, female    DOB: 18-Apr-1976, 44 y.o.   MRN: [id number]  HPI   Patient is a 44 yr old female who presents today for follow up.   We last saw the patient back in November 2020.  At that time she was 30 days alcohol free.  She reports that after that time she had a relapse where she was "drinking 24/7."  Her boyfriend helped to facilitate her admission to a rehab facility.  Following alcohol detox, she was admitted on Feb 12, 2020 to [facility] which is in [place].  It was a 30 day rehab.    Following rehab she reports that she feels "very strong and comfortable." She is 54 days alcohol free currently.   Anxiety/depression- during her stay at the rehab facility, she was placed on pristiq. Reports that she feels "really good on this medicine."  Reports that she feels emotions but is not overly emotional.  States she still has anxiety attacks occasionally.   Sometimes she has to take 2 tabs of xanax.     Review of Systems    see HPI  Past Medical History:  Diagnosis Date  . Blood type, Rh negative   . Yeast infection      Social History   Socioeconomic History  . Marital status: Divorced    Spouse name: Not on file  . Number of children: Not on file  . Years of education: Not on file  . Highest education level: Not on file  Occupational History  . Not on file  Tobacco Use  . Smoking status: Current Every Day Smoker    Packs/day: 0.25    Years: 2.00    Pack years: 0.50    Types: Cigarettes  . Smokeless tobacco: Never Used  Substance and Sexual Activity  . Alcohol use: No  . Drug use: No  . Sexual activity: Yes    Birth control/protection: I.U.D.    Comment: Mirena X 03/2011  Other Topics Concern  . 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 Difficulty of Paying Living Expenses:   Food Insecurity:   . Worried About Programme researcher, broadcasting/film/video in the Last Year:   . Barista in the Last Year:   Transportation Needs:   . Freight forwarder (Medical):   Marland Kitchen Lack of Transportation (Non-Medical):   Physical Activity:   . Days of Exercise per Week:   . Minutes of Exercise per Session:   Stress:   . Feeling of Stress :   Social Connections:   . Frequency of Communication with Friends and Family:   . Frequency of Social Gatherings with Friends and Family:   . Attends Religious Services:   . Active Member of Clubs or Organizations:   . Attends Banker Meetings:   Marland Kitchen Marital Status:   Intimate Partner Violence:   . Fear of Current or Ex-Partner:   . Emotionally Abused:   Marland Kitchen Physically Abused:   . Sexually Abused:     Past Surgical History:  Procedure Laterality Date  . BREAST ENHANCEMENT SURGERY  07/2014  . CESAREAN SECTION    . LAPAROSCOPIC APPENDECTOMY N/A 04/11/2016   Procedure: APPENDECTOMY LAPAROSCOPIC;  Surgeon: Violeta Gelinas, MD;  Location: [facility] OR;  Service: General;  Laterality: N/A;  . lipoma Left 2007    Family History  Problem Relation Age of Onset  . Peptic Ulcer Father   . Hypertension Mother   . Aneurysm Mother     Allergies  Allergen Reactions  . Lexapro [Escitalopram Oxalate] Other (See Comments)    LACK OF EMOTION    Current Outpatient Medications on File Prior to Visit  Medication Sig Dispense Refill  . ALPRAZolam (XANAX) 0.25 MG tablet TAKE 1 TABLET (0.25 MG TOTAL) BY MOUTH 2 (TWO) TIMES DAILY AS NEEDED FOR ANXIETY 30 tablet 0  . Desvenlafaxine Succinate ER (PRISTIQ) 25 MG TB24 Take by mouth.    . levonorgestrel (MIRENA) 20 MCG/24HR IUD 1 each by Intrauterine route once.    . valACYclovir (VALTREX) 500 MG tablet Take 1 tablet by mouth daily as needed.     No  current facility-administered medications on file prior to visit.    BP 110/62 (BP Location: Left Arm, Patient Position: Sitting, Cuff Size: Large)   Pulse 78   Resp 16   Ht 5\' 2"  (1.575 m)   Wt 165 lb (74.8 kg)   SpO2 98%   BMI 30.18 kg/m    Objective:   Physical Exam Constitutional:      Appearance: She is well-developed.  Neck:     Thyroid: No thyromegaly.  Cardiovascular:     Rate and Rhythm: Normal rate and regular rhythm.     Heart sounds: Normal heart sounds. No murmur heard.   Pulmonary:     Effort: Pulmonary effort is normal. No respiratory distress.     Breath sounds: Normal breath sounds. No wheezing.  Musculoskeletal:     Cervical back: Neck supple.  Skin:    General: Skin is warm and dry.  Neurological:     Mental Status: She is alert and oriented to person, place, and time.  Psychiatric:        Behavior: Behavior normal.        Thought Content: Thought content normal.        Judgment: Judgment normal.           Assessment & Plan:  Anxiety/depression-depression is well controlled on pristiq.  Still having some breakthrough anxiety.  I will increase her Xanax from 0.25 to 0.5 mg twice daily as needed.  A controlled substance contract is signed today and urine drug screen will be updated.    Hx of ETOH abuse- she is currrently alcohol free. Encouraged her to keep up the good work.   Insomnia-she reports that she does have occasional insomnia.  At the rehab facility she was given trazodone as needed which was helpful.  She does take melatonin at bedtime which is also helpful.  I have given her prescription for as needed trazodone.  This visit occurred during the SARS-CoV-2 public health emergency.  Safety protocols were in place, including screening questions prior to the visit, additional usage of staff PPE, and extensive cleaning of exam room while observing appropriate contact time as indicated for disinfecting solutions.

## 2020-04-07 LAB — DRUG MONITORING, PANEL 8 WITH CONFIRMATION, URINE
6 Acetylmorphine: NEGATIVE ng/mL (ref <10)
Alcohol Metabolites: NEGATIVE ng/mL
Alphahydroxyalprazolam: 63 ng/mL — ABNORMAL HIGH (ref <25)
Alphahydroxymidazolam: NEGATIVE ng/mL (ref <50)
Alphahydroxytriazolam: NEGATIVE ng/mL (ref <50)
Aminoclonazepam: NEGATIVE ng/mL (ref <25)
Amphetamines: NEGATIVE ng/mL (ref <500)
Benzodiazepines: POSITIVE ng/mL — AB (ref <100)
Buprenorphine, Urine: NEGATIVE ng/mL (ref <5)
Cocaine Metabolite: NEGATIVE ng/mL (ref <150)
Creatinine: >300 mg/dL
Hydroxyethylflurazepam: NEGATIVE ng/mL (ref <50)
Lorazepam: NEGATIVE ng/mL (ref <50)
MDMA: NEGATIVE ng/mL (ref <500)
Marijuana Metabolite: NEGATIVE ng/mL (ref <20)
Nordiazepam: NEGATIVE ng/mL (ref <50)
Opiates: NEGATIVE ng/mL (ref <100)
Oxazepam: NEGATIVE ng/mL (ref <50)
Oxidant: NEGATIVE ug/mL
Oxycodone: NEGATIVE ng/mL (ref <100)
Temazepam: NEGATIVE ng/mL (ref <50)
pH: 5.5 (ref 4.5–9.0)

## 2020-04-07 LAB — DM TEMPLATE

## 2020-04-10 MED FILL — DESVENLAFAXINE SUCCINATE ER: 25 | 90 days supply | Qty: 90 | Fill #0

## 2020-05-09 ENCOUNTER — Other Ambulatory Visit: Payer: Self-pay | Admitting: Family

## 2020-05-09 MED FILL — traZODone HCL 50 MG TABS: 50 | 30 days supply | Qty: 30 | Fill #1

## 2020-05-09 NOTE — Telephone Encounter (Signed)
Requesting: xanax Contract: 11/22/19 UDS:04/05/20 Last Visit:08/08/2019 Next Visit:07/14/20 Last Refill:04/05/20  Please Advise

## 2020-05-10 MED FILL — ALPRAZolam 0.5 MG TABS: 0.5 | 15 days supply | Qty: 30 | Fill #0

## 2020-05-31 ENCOUNTER — Other Ambulatory Visit: Payer: Self-pay | Admitting: Family

## 2020-06-01 MED FILL — ALPRAZolam 0.5 MG TABS: 0.5 | 15 days supply | Qty: 30 | Fill #0

## 2020-07-03 ENCOUNTER — Other Ambulatory Visit: Payer: Self-pay | Admitting: Family

## 2020-07-03 MED FILL — ALPRAZolam 0.5 MG TABS: 0.5 | 15 days supply | Qty: 30 | Fill #0

## 2020-07-14 ENCOUNTER — Encounter: Payer: 59 | Admitting: Family

## 2020-07-26 ENCOUNTER — Other Ambulatory Visit: Payer: Self-pay

## 2020-07-26 ENCOUNTER — Telehealth: Payer: Self-pay | Admitting: Family

## 2020-07-26 ENCOUNTER — Ambulatory Visit (INDEPENDENT_AMBULATORY_CARE_PROVIDER_SITE_OTHER): Payer: 59 | Admitting: Family

## 2020-07-26 ENCOUNTER — Encounter: Payer: Self-pay | Admitting: Family

## 2020-07-26 ENCOUNTER — Other Ambulatory Visit: Payer: Self-pay | Admitting: Family

## 2020-07-26 VITALS — BP 134/75 | HR 78 | Temp 98.3°F | Resp 16 | Ht 62.0 in | Wt 154.0 lb

## 2020-07-26 DIAGNOSIS — F1011 Alcohol abuse, in remission: Secondary | ICD-10-CM | POA: Diagnosis not present

## 2020-07-26 DIAGNOSIS — Z0000 Encounter for general adult medical examination without abnormal findings: Secondary | ICD-10-CM | POA: Diagnosis not present

## 2020-07-26 DIAGNOSIS — F418 Other specified anxiety disorders: Secondary | ICD-10-CM

## 2020-07-26 DIAGNOSIS — D649 Anemia, unspecified: Secondary | ICD-10-CM | POA: Diagnosis not present

## 2020-07-26 DIAGNOSIS — Z23 Encounter for immunization: Secondary | ICD-10-CM

## 2020-07-26 MED ORDER — ALPRAZOLAM 0.5 MG PO TABS: 0.5000 mg | ORAL_TABLET | Freq: Two times a day (BID) | ORAL | 0 refills | Status: DC | PRN

## 2020-07-26 MED FILL — ALPRAZolam 0.5 MG TABS: 0.5 | 15 days supply | Qty: 30 | Fill #0

## 2020-07-26 NOTE — Telephone Encounter (Signed)
Records release faxed 

## 2020-07-26 NOTE — Telephone Encounter (Signed)
Please request copy of pap and mammo from Dr. Dois Davenport Rivard.

## 2020-07-26 NOTE — Patient Instructions (Signed)
Please schedule a routine dental exam.  

## 2020-07-26 NOTE — Progress Notes (Signed)
Subjective:    Patient ID: Pamela Morris, female    DOB: 08/30/1976, 44 y.o.   MRN: [id number]  HPI  Patient is a 44 yr old female who presents today for cpx.  Immunizations:  Completed Loss adjuster, chartered.  Flu shot today. Diet: trying ot improve her diet.   Wt Readings from Last 3 Encounters:  07/26/20 154 lb (69.9 kg)  04/05/20 165 lb (74.8 kg)  07/31/18 160 lb 3.2 oz (72.7 kg)  Exercise:  Trying to walk on her lunch breaks Pap Smear: up to date Mammogram: up to date Dental: due Vision: up to date  Hx of Alcohol abuse-Reports occasional social alcohol.  Was dry for 4 months.  T  Anxiety/Depression-  She continues pristiq.  Son has been having issues with anxiety/depression and this has been very stressful for her. She is using xanax more than she normally does.  Woke up at 1 AM having a panic attack.  Had a recent breakup.   Insomnia- Reports that she has been doing well on trazodone.  Does not take trazodone every night.  Review of Systems  Constitutional: Negative for unexpected weight change.  HENT: Negative for rhinorrhea (  ).   Eyes: Negative for visual disturbance.  Respiratory: Negative for cough.   Cardiovascular: Negative for leg swelling.  Gastrointestinal: Negative for constipation and diarrhea.  Genitourinary: Negative for dysuria and frequency.  Musculoskeletal: Negative for arthralgias and myalgias.  Skin: Negative for rash.  Neurological: Negative for headaches.  Hematological: Negative for adenopathy.  Psychiatric/Behavioral:       See HPI   Past Medical History:  Diagnosis Date  . Blood type, Rh negative   . Yeast infection      Social History   Socioeconomic History  . Marital status: Divorced    Spouse name: Not on file  . Number of children: Not on file  . Years of education: Not on file  . Highest education level: Not on file  Occupational History  . Not on file  Tobacco Use  . Smoking status: Former Smoker    Packs/day: 0.25     Years: 2.00    Pack years: 0.50    Types: Cigarettes  . Smokeless tobacco: Never Used  Substance and Sexual Activity  . Alcohol use: Yes    Comment: occasiona;  Marland Kitchen Drug use: No  . Sexual activity: Yes    Birth control/protection: I.U.D.    Comment: Mirena X 03/2011  Other Topics Concern  . 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 Difficulty of Paying Living Expenses: Not on file  Food Insecurity:   . Worried About Programme researcher, broadcasting/film/video in the Last Year: Not on file  . Ran Out of Food in the Last Year: Not on file  Transportation Needs:   . Lack of Transportation (Medical): Not on file  . Lack of Transportation (Non-Medical): Not on file  Physical Activity:   . Days of Exercise per Week: Not on file  . Minutes of Exercise per Session: Not on file  Stress:   . Feeling of Stress : Not on file  Social Connections:   . Frequency of  Communication with Friends and Family: Not on file  . Frequency of Social Gatherings with Friends and Family: Not on file  . Attends Religious Services: Not on file  . Active Member of Clubs or Organizations: Not on file  . Attends Banker Meetings: Not on file  . Marital Status: Not on file  Intimate Partner Violence:   . Fear of Current or Ex-Partner: Not on file  . Emotionally Abused: Not on file  . Physically Abused: Not on file  . Sexually Abused: Not on file    Past Surgical History:  Procedure Laterality Date  . BREAST ENHANCEMENT SURGERY  07/2014  . CESAREAN SECTION    . LAPAROSCOPIC APPENDECTOMY N/A 04/11/2016   Procedure: APPENDECTOMY LAPAROSCOPIC;  Surgeon: Violeta Gelinas, MD;  Location: [facility] OR;  Service: General;  Laterality: N/A;  . lipoma  Left 2007    Family History  Problem Relation Age of Onset  . Peptic Ulcer Father   . Hypertension Mother   . Aneurysm Mother     Allergies  Allergen Reactions  . Lexapro [Escitalopram Oxalate] Other (See Comments)    LACK OF EMOTION    Current Outpatient Medications on File Prior to Visit  Medication Sig Dispense Refill  . ALPRAZolam (XANAX) 0.5 MG tablet TAKE 1 TABLET BY MOUTH TWICE DAILY AS NEEDED FOR ANXIETY 30 tablet 0  . Desvenlafaxine Succinate ER (PRISTIQ) 25 MG TB24 Take 25 mg by mouth daily. 90 tablet 1  . levonorgestrel (MIRENA) 20 MCG/24HR IUD 1 each by Intrauterine route once.    . traZODone (DESYREL) 50 MG tablet Take 0.5-1 tablets (25-50 mg total) by mouth at bedtime as needed for sleep. 30 tablet 3  . valACYclovir (VALTREX) 500 MG tablet Take 1 tablet by mouth daily as needed.     No current facility-administered medications on file prior to visit.    BP (!) 134/98 (BP Location: Right Arm, Patient Position: Sitting, Cuff Size: Small)   Pulse 78   Temp 98.3 F (36.8 C) (Oral)   Resp 16   Ht 5\' 2"  (1.575 m)   Wt 154 lb (69.9 kg)   SpO2 100%   BMI 28.17 kg/m       Objective:   Physical Exam  Physical Exam  Constitutional: She is oriented to person, place, and time. She appears well-developed and well-nourished. No distress.  HENT:  Head: Normocephalic and atraumatic.  Right Ear: Tympanic membrane and ear canal normal.  Left Ear: Tympanic membrane and ear canal normal.  Mouth/Throat: not examined- pt wearing mask Eyes: Pupils are equal, round, and reactive to light. No scleral icterus.  Neck: Normal range of motion. No thyromegaly present.  Cardiovascular: Normal rate and regular rhythm.   No murmur heard. Pulmonary/Chest: Effort normal and breath sounds normal. No respiratory distress. He has no wheezes. She has no rales. She exhibits no tenderness.  Abdominal: Soft. Bowel sounds are normal. She exhibits no distension and no mass. There is no  tenderness. There is no rebound and no guarding.  Musculoskeletal: She exhibits no edema.  Lymphadenopathy:    She has no cervical adenopathy.  Neurological: She is alert and oriented to person, place, and time. She exhibits normal muscle tone. Coordination normal.  Skin: Skin is warm and dry.  Psychiatric: She has a normal mood and affect. Her behavior is normal. Judgment and thought content normal.  Breasts: Examined lying. Pt has bilateral breast implants Right: Without masses, retractions, discharge or axillary adenopathy.  Left: Without masses, retractions, discharge  or axillary adenopathy.           Assessment & Plan:  Preventative care- encouraged pt to continue her good work with healthy diet, exercise and weight loss. Will request copy of pap/mammo from GYN. Flu shot today.  Depression/anxiety- advised pt ok to continue xanax as needed.  Original rx was cancelled by cma and was resent to a different pharmacy at pt request.  Insomnia- stable on PRN trazodone.  History of alcohol abuse- seems to be doing well overall. Monitor.  Anemia-  Check cbc.    This visit occurred during the SARS-CoV-2 public health emergency.  Safety protocols were in place, including screening questions prior to the visit, additional usage of staff PPE, and extensive cleaning of exam room while observing appropriate contact time as indicated for disinfecting solutions.             Assessment & Plan:

## 2020-07-27 LAB — CBC WITH DIFFERENTIAL/PLATELET
Absolute Monocytes: 558 cells/uL (ref 200–950)
Basophils Absolute: 41 cells/uL (ref 0–200)
Basophils Relative: 0.6 %
Eosinophils Absolute: 82 cells/uL (ref 15–500)
Eosinophils Relative: 1.2 %
HCT: 42.7 % (ref 35.0–45.0)
Hemoglobin: 14.4 g/dL (ref 11.7–15.5)
Lymphs Abs: 1346 cells/uL (ref 850–3900)
MCH: 31.2 pg (ref 27.0–33.0)
MCHC: 33.7 g/dL (ref 32.0–36.0)
MCV: 92.4 fL (ref 80.0–100.0)
MPV: 9.3 fL (ref 7.5–12.5)
Monocytes Relative: 8.2 %
Neutro Abs: 4774 cells/uL (ref 1500–7800)
Neutrophils Relative %: 70.2 %
Platelets: 347 10*3/uL (ref 140–400)
RBC: 4.62 10*6/uL (ref 3.80–5.10)
RDW: 12.7 % (ref 11.0–15.0)
Total Lymphocyte: 19.8 %
WBC: 6.8 10*3/uL (ref 3.8–10.8)

## 2020-07-27 LAB — COMPREHENSIVE METABOLIC PANEL
AG Ratio: 1.7 (calc) (ref 1.0–2.5)
ALT: 37 U/L — ABNORMAL HIGH (ref 6–29)
AST: 28 U/L (ref 10–35)
Albumin: 4.4 g/dL (ref 3.6–5.1)
Alkaline phosphatase (APISO): 71 U/L (ref 31–125)
BUN: 10 mg/dL (ref 7–25)
CO2: 26 mmol/L (ref 20–32)
Calcium: 9.5 mg/dL (ref 8.6–10.2)
Chloride: 100 mmol/L (ref 98–110)
Creat: 0.73 mg/dL (ref 0.50–1.10)
Globulin: 2.6 g/dL (calc) (ref 1.9–3.7)
Glucose, Bld: 99 mg/dL (ref 65–99)
Potassium: 4.3 mmol/L (ref 3.5–5.3)
Sodium: 137 mmol/L (ref 135–146)
Total Bilirubin: 0.5 mg/dL (ref 0.2–1.2)
Total Protein: 7 g/dL (ref 6.1–8.1)

## 2020-08-03 MED FILL — DESVENLAFAXINE SUCCINATE ER: 25 | 90 days supply | Qty: 90 | Fill #1

## 2020-08-11 MED FILL — DESVENLAFAXINE SUCCINATE ER: 25 | 90 days supply | Qty: 90 | Fill #1

## 2020-08-28 ENCOUNTER — Other Ambulatory Visit: Payer: Self-pay | Admitting: Family

## 2020-08-28 MED FILL — ALPRAZolam 0.5 MG TABS: 0.5 | 15 days supply | Qty: 30 | Fill #0

## 2020-09-05 ENCOUNTER — Other Ambulatory Visit (HOSPITAL_COMMUNITY): Payer: Self-pay | Admitting: Obstetrics and Gynecology

## 2020-09-05 MED FILL — FLUCONAZOLE 150 MG TABS: 150 | 1 days supply | Qty: 1 | Fill #0

## 2020-09-05 MED FILL — ALPRAZolam 0.5 MG TABS: 0.5 | 15 days supply | Qty: 30 | Fill #0

## 2020-11-04 ENCOUNTER — Other Ambulatory Visit: Payer: Self-pay | Admitting: Family

## 2020-11-05 ENCOUNTER — Other Ambulatory Visit: Payer: Self-pay | Admitting: Family

## 2020-11-06 MED FILL — ALPRAZolam 0.5 MG TABS: 0.5 | 15 days supply | Qty: 30 | Fill #0

## 2020-11-12 ENCOUNTER — Inpatient Hospital Stay (HOSPITAL_COMMUNITY)
Admission: EM | Admit: 2020-11-12 | Discharge: 2020-11-15 | DRG: 897 | Disposition: A | Discharge status: Home / Self Care | Payer: 59 | Attending: Family Medicine | Admitting: Family Medicine

## 2020-11-12 ENCOUNTER — Emergency Department (HOSPITAL_COMMUNITY): Payer: 59

## 2020-11-12 ENCOUNTER — Observation Stay (HOSPITAL_COMMUNITY): Payer: 59

## 2020-11-12 ENCOUNTER — Other Ambulatory Visit: Payer: Self-pay

## 2020-11-12 ENCOUNTER — Encounter (HOSPITAL_COMMUNITY): Payer: Self-pay | Admitting: Internal Medicine

## 2020-11-12 DIAGNOSIS — F410 Panic disorder [episodic paroxysmal anxiety] without agoraphobia: Secondary | ICD-10-CM | POA: Diagnosis present

## 2020-11-12 DIAGNOSIS — Z87891 Personal history of nicotine dependence: Secondary | ICD-10-CM

## 2020-11-12 DIAGNOSIS — Z79899 Other long term (current) drug therapy: Secondary | ICD-10-CM | POA: Diagnosis not present

## 2020-11-12 DIAGNOSIS — E872 Acidosis: Secondary | ICD-10-CM | POA: Diagnosis not present

## 2020-11-12 DIAGNOSIS — E8729 Other acidosis: Secondary | ICD-10-CM

## 2020-11-12 DIAGNOSIS — R072 Precordial pain: Secondary | ICD-10-CM | POA: Diagnosis not present

## 2020-11-12 DIAGNOSIS — R9431 Abnormal electrocardiogram [ECG] [EKG]: Secondary | ICD-10-CM

## 2020-11-12 DIAGNOSIS — I426 Alcoholic cardiomyopathy: Secondary | ICD-10-CM | POA: Diagnosis present

## 2020-11-12 DIAGNOSIS — Z975 Presence of (intrauterine) contraceptive device: Secondary | ICD-10-CM

## 2020-11-12 DIAGNOSIS — K760 Fatty (change of) liver, not elsewhere classified: Secondary | ICD-10-CM | POA: Diagnosis not present

## 2020-11-12 DIAGNOSIS — K7010 Alcoholic hepatitis without ascites: Secondary | ICD-10-CM | POA: Diagnosis present

## 2020-11-12 DIAGNOSIS — I361 Nonrheumatic tricuspid (valve) insufficiency: Secondary | ICD-10-CM | POA: Diagnosis not present

## 2020-11-12 DIAGNOSIS — Y900 Blood alcohol level of less than 20 mg/100 ml: Secondary | ICD-10-CM | POA: Diagnosis present

## 2020-11-12 DIAGNOSIS — D61818 Other pancytopenia: Secondary | ICD-10-CM | POA: Diagnosis present

## 2020-11-12 DIAGNOSIS — F10930 Alcohol use, unspecified with withdrawal, uncomplicated: Secondary | ICD-10-CM

## 2020-11-12 DIAGNOSIS — Z888 Allergy status to other drugs, medicaments and biological substances status: Secondary | ICD-10-CM

## 2020-11-12 DIAGNOSIS — R7989 Other specified abnormal findings of blood chemistry: Secondary | ICD-10-CM

## 2020-11-12 DIAGNOSIS — E876 Hypokalemia: Secondary | ICD-10-CM | POA: Diagnosis not present

## 2020-11-12 DIAGNOSIS — E860 Dehydration: Secondary | ICD-10-CM | POA: Diagnosis present

## 2020-11-12 DIAGNOSIS — K219 Gastro-esophageal reflux disease without esophagitis: Secondary | ICD-10-CM | POA: Diagnosis present

## 2020-11-12 DIAGNOSIS — R112 Nausea with vomiting, unspecified: Secondary | ICD-10-CM

## 2020-11-12 DIAGNOSIS — D6959 Other secondary thrombocytopenia: Secondary | ICD-10-CM | POA: Diagnosis present

## 2020-11-12 DIAGNOSIS — Z9181 History of falling: Secondary | ICD-10-CM

## 2020-11-12 DIAGNOSIS — R079 Chest pain, unspecified: Secondary | ICD-10-CM | POA: Diagnosis not present

## 2020-11-12 DIAGNOSIS — F32A Depression, unspecified: Secondary | ICD-10-CM | POA: Diagnosis not present

## 2020-11-12 DIAGNOSIS — R002 Palpitations: Secondary | ICD-10-CM | POA: Diagnosis not present

## 2020-11-12 DIAGNOSIS — F4323 Adjustment disorder with mixed anxiety and depressed mood: Secondary | ICD-10-CM | POA: Diagnosis present

## 2020-11-12 DIAGNOSIS — F10239 Alcohol dependence with withdrawal, unspecified: Secondary | ICD-10-CM | POA: Diagnosis not present

## 2020-11-12 DIAGNOSIS — F10230 Alcohol dependence with withdrawal, uncomplicated: Principal | ICD-10-CM | POA: Diagnosis present

## 2020-11-12 DIAGNOSIS — F411 Generalized anxiety disorder: Secondary | ICD-10-CM | POA: Diagnosis not present

## 2020-11-12 DIAGNOSIS — R000 Tachycardia, unspecified: Secondary | ICD-10-CM | POA: Diagnosis not present

## 2020-11-12 DIAGNOSIS — Z20822 Contact with and (suspected) exposure to covid-19: Secondary | ICD-10-CM | POA: Diagnosis present

## 2020-11-12 DIAGNOSIS — Z793 Long term (current) use of hormonal contraceptives: Secondary | ICD-10-CM

## 2020-11-12 DIAGNOSIS — F10939 Alcohol use, unspecified with withdrawal, unspecified: Secondary | ICD-10-CM | POA: Diagnosis present

## 2020-11-12 DIAGNOSIS — R945 Abnormal results of liver function studies: Secondary | ICD-10-CM | POA: Diagnosis not present

## 2020-11-12 HISTORY — DX: Abnormal electrocardiogram (ECG) (EKG): R94.31

## 2020-11-12 LAB — PROTIME-INR
INR: 1.1 (ref 0.8–1.2)
INR: 1.1 (ref 0.8–1.2)
Prothrombin Time: 13.3 seconds (ref 11.4–15.2)
Prothrombin Time: 13.6 seconds (ref 11.4–15.2)

## 2020-11-12 LAB — LIPASE, BLOOD: Lipase: 36 U/L (ref 11–51)

## 2020-11-12 LAB — CBC WITH DIFFERENTIAL/PLATELET
Abs Immature Granulocytes: 0.07 10*3/uL (ref 0.00–0.07)
Basophils Absolute: 0 10*3/uL (ref 0.0–0.1)
Basophils Relative: 0 %
Eosinophils Absolute: 0.4 10*3/uL (ref 0.0–0.5)
Eosinophils Relative: 4 %
HCT: 41.2 % (ref 36.0–46.0)
Hemoglobin: 14.5 g/dL (ref 12.0–15.0)
Immature Granulocytes: 1 %
Lymphocytes Relative: 5 %
Lymphs Abs: 0.5 10*3/uL — ABNORMAL LOW (ref 0.7–4.0)
MCH: 31.9 pg (ref 26.0–34.0)
MCHC: 35.2 g/dL (ref 30.0–36.0)
MCV: 90.5 fL (ref 80.0–100.0)
Monocytes Absolute: 0.5 10*3/uL (ref 0.1–1.0)
Monocytes Relative: 5 %
Neutro Abs: 8.8 10*3/uL — ABNORMAL HIGH (ref 1.7–7.7)
Neutrophils Relative %: 85 %
Platelets: 184 10*3/uL (ref 150–400)
RBC: 4.55 MIL/uL (ref 3.87–5.11)
RDW: 11.8 % (ref 11.5–15.5)
WBC: 10.4 10*3/uL (ref 4.0–10.5)
nRBC: 0 % (ref 0.0–0.2)

## 2020-11-12 LAB — RESP PANEL BY RT-PCR (FLU A&B, COVID) ARPGX2
Influenza A by PCR: NEGATIVE
Influenza B by PCR: NEGATIVE
SARS Coronavirus 2 by RT PCR: NEGATIVE

## 2020-11-12 LAB — CBC
HCT: 36.2 % (ref 36.0–46.0)
Hemoglobin: 12.9 g/dL (ref 12.0–15.0)
MCH: 31.9 pg (ref 26.0–34.0)
MCHC: 35.6 g/dL (ref 30.0–36.0)
MCV: 89.6 fL (ref 80.0–100.0)
Platelets: 144 10*3/uL — ABNORMAL LOW (ref 150–400)
RBC: 4.04 MIL/uL (ref 3.87–5.11)
RDW: 11.8 % (ref 11.5–15.5)
WBC: 11.3 10*3/uL — ABNORMAL HIGH (ref 4.0–10.5)
nRBC: 0 % (ref 0.0–0.2)

## 2020-11-12 LAB — COMPREHENSIVE METABOLIC PANEL
ALT: 179 U/L — ABNORMAL HIGH (ref 0–44)
AST: 198 U/L — ABNORMAL HIGH (ref 15–41)
Albumin: 4.7 g/dL (ref 3.5–5.0)
Alkaline Phosphatase: 77 U/L (ref 38–126)
Anion gap: 23 — ABNORMAL HIGH (ref 5–15)
BUN: <5 mg/dL — ABNORMAL LOW (ref 6–20)
CO2: 16 mmol/L — ABNORMAL LOW (ref 22–32)
Calcium: 9 mg/dL (ref 8.9–10.3)
Chloride: 94 mmol/L — ABNORMAL LOW (ref 98–111)
Creatinine, Ser: 1.09 mg/dL — ABNORMAL HIGH (ref 0.44–1.00)
GFR, Estimated: >60 mL/min (ref >60)
Glucose, Bld: 157 mg/dL — ABNORMAL HIGH (ref 70–99)
Potassium: 2.8 mmol/L — ABNORMAL LOW (ref 3.5–5.1)
Sodium: 133 mmol/L — ABNORMAL LOW (ref 135–145)
Total Bilirubin: 1.5 mg/dL — ABNORMAL HIGH (ref 0.3–1.2)
Total Protein: 8.2 g/dL — ABNORMAL HIGH (ref 6.5–8.1)

## 2020-11-12 LAB — PHOSPHORUS: Phosphorus: 1.4 mg/dL — ABNORMAL LOW (ref 2.5–4.6)

## 2020-11-12 LAB — HEPATITIS PANEL, ACUTE
HCV Ab: NONREACTIVE
Hep A IgM: NONREACTIVE
Hep B C IgM: NONREACTIVE
Hepatitis B Surface Ag: NONREACTIVE

## 2020-11-12 LAB — ETHANOL: Alcohol, Ethyl (B): <10 mg/dL (ref <10)

## 2020-11-12 LAB — HIV ANTIBODY (ROUTINE TESTING W REFLEX): HIV Screen 4th Generation wRfx: NONREACTIVE

## 2020-11-12 LAB — MAGNESIUM: Magnesium: 1.5 mg/dL — ABNORMAL LOW (ref 1.7–2.4)

## 2020-11-12 MED ORDER — METOPROLOL TARTRATE 5 MG/5ML IV SOLN: 5.0000 mg | Freq: Four times a day (QID) | INTRAVENOUS | Status: DC | PRN

## 2020-11-12 MED ORDER — PROCHLORPERAZINE MALEATE 10 MG PO TABS: 5.0000 mg | ORAL_TABLET | Freq: Four times a day (QID) | ORAL | Status: DC | PRN

## 2020-11-12 MED ORDER — CHLORDIAZEPOXIDE HCL 25 MG PO CAPS
100.0000 mg | ORAL_CAPSULE | Freq: Once | ORAL | Status: AC
  Administered 2020-11-12: 100 mg via ORAL
  Filled 2020-11-12: qty 4

## 2020-11-12 MED ORDER — MAGNESIUM SULFATE 4 GM/100ML IV SOLN
4.0000 g | Freq: Once | INTRAVENOUS | Status: AC
  Administered 2020-11-12: 4 g via INTRAVENOUS
  Filled 2020-11-12: qty 100

## 2020-11-12 MED ORDER — PANTOPRAZOLE SODIUM 40 MG IV SOLR: 40.0000 mg | Freq: Once | INTRAVENOUS | Status: DC

## 2020-11-12 MED ORDER — SODIUM CHLORIDE 0.9% FLUSH
3.0000 mL | Freq: Two times a day (BID) | INTRAVENOUS | Status: DC
  Administered 2020-11-12 – 2020-11-15 (×6): 3 mL via INTRAVENOUS

## 2020-11-12 MED ORDER — LORAZEPAM 2 MG/ML IJ SOLN
1.0000 mg | INTRAMUSCULAR | Status: AC | PRN
  Administered 2020-11-14: 1 mg via INTRAVENOUS
  Filled 2020-11-12 (×2): qty 1

## 2020-11-12 MED ORDER — FAMOTIDINE IN NACL 20-0.9 MG/50ML-% IV SOLN: 20.0000 mg | INTRAVENOUS | Status: DC

## 2020-11-12 MED ORDER — ONDANSETRON HCL 4 MG PO TABS: 4.0000 mg | ORAL_TABLET | Freq: Four times a day (QID) | ORAL | Status: DC | PRN

## 2020-11-12 MED ORDER — FAMOTIDINE 20 MG PO TABS
20.0000 mg | ORAL_TABLET | Freq: Every day | ORAL | Status: DC
  Administered 2020-11-12 – 2020-11-13 (×2): 20 mg via ORAL
  Filled 2020-11-12 (×2): qty 1

## 2020-11-12 MED ORDER — THIAMINE HCL 100 MG PO TABS
100.0000 mg | ORAL_TABLET | Freq: Every day | ORAL | Status: DC
  Administered 2020-11-12 – 2020-11-15 (×4): 100 mg via ORAL
  Filled 2020-11-12 (×4): qty 1

## 2020-11-12 MED ORDER — POTASSIUM CHLORIDE 10 MEQ/100ML IV SOLN
10.0000 meq | Freq: Once | INTRAVENOUS | Status: DC
  Filled 2020-11-12: qty 100

## 2020-11-12 MED ORDER — POTASSIUM PHOSPHATES 15 MMOLE/5ML IV SOLN
30.0000 mmol | Freq: Once | INTRAVENOUS | Status: AC
  Administered 2020-11-12: 30 mmol via INTRAVENOUS
  Filled 2020-11-12: qty 10

## 2020-11-12 MED ORDER — ENOXAPARIN SODIUM 40 MG/0.4ML ~~LOC~~ SOLN
40.0000 mg | SUBCUTANEOUS | Status: DC
  Administered 2020-11-12 – 2020-11-14 (×3): 40 mg via SUBCUTANEOUS
  Filled 2020-11-12 (×3): qty 0.4

## 2020-11-12 MED ORDER — PROCHLORPERAZINE EDISYLATE 10 MG/2ML IJ SOLN: 5.0000 mg | Freq: Four times a day (QID) | INTRAMUSCULAR | Status: DC | PRN

## 2020-11-12 MED ORDER — ONDANSETRON HCL 4 MG/2ML IJ SOLN: 4.0000 mg | Freq: Four times a day (QID) | INTRAMUSCULAR | Status: DC | PRN

## 2020-11-12 MED ORDER — LORAZEPAM 1 MG PO TABS
1.0000 mg | ORAL_TABLET | ORAL | Status: AC | PRN
  Administered 2020-11-12: 2 mg via ORAL
  Administered 2020-11-12 – 2020-11-15 (×5): 1 mg via ORAL
  Filled 2020-11-12 (×4): qty 1
  Filled 2020-11-12: qty 2

## 2020-11-12 MED ORDER — FOLIC ACID 1 MG PO TABS
1.0000 mg | ORAL_TABLET | Freq: Every day | ORAL | Status: DC
  Administered 2020-11-12 – 2020-11-15 (×4): 1 mg via ORAL
  Filled 2020-11-12 (×4): qty 1

## 2020-11-12 MED ORDER — SODIUM CHLORIDE 0.9 % IV BOLUS
1000.0000 mL | Freq: Once | INTRAVENOUS | Status: AC
  Administered 2020-11-12: 1000 mL via INTRAVENOUS

## 2020-11-12 MED ORDER — LACTATED RINGERS IV SOLN: INTRAVENOUS | Status: AC

## 2020-11-12 MED ORDER — ADULT MULTIVITAMIN W/MINERALS CH
1.0000 | ORAL_TABLET | Freq: Every day | ORAL | Status: DC
  Administered 2020-11-12 – 2020-11-15 (×4): 1 via ORAL
  Filled 2020-11-12 (×4): qty 1

## 2020-11-12 MED ORDER — POTASSIUM CHLORIDE 10 MEQ/100ML IV SOLN
10.0000 meq | INTRAVENOUS | Status: DC
  Administered 2020-11-12: 10 meq via INTRAVENOUS

## 2020-11-12 MED ORDER — ONDANSETRON HCL 4 MG/2ML IJ SOLN
4.0000 mg | Freq: Once | INTRAMUSCULAR | Status: AC
  Administered 2020-11-12: 4 mg via INTRAVENOUS
  Filled 2020-11-12: qty 2

## 2020-11-12 MED ORDER — LORAZEPAM 2 MG/ML IJ SOLN
2.0000 mg | Freq: Once | INTRAMUSCULAR | Status: AC
  Administered 2020-11-12: 2 mg via INTRAVENOUS
  Filled 2020-11-12: qty 1

## 2020-11-12 MED ORDER — THIAMINE HCL 100 MG/ML IJ SOLN
100.0000 mg | Freq: Every day | INTRAMUSCULAR | Status: DC
  Filled 2020-11-12: qty 2

## 2020-11-12 MED ORDER — LORAZEPAM 2 MG/ML IJ SOLN
1.0000 mg | Freq: Once | INTRAMUSCULAR | Status: AC
  Administered 2020-11-12: 1 mg via INTRAVENOUS
  Filled 2020-11-12: qty 1

## 2020-11-12 NOTE — ED Triage Notes (Signed)
Pt came in with c/o anxiety, arm pain, and dehydration. Pt states she has been drinking more than normal this past week, and her last drink was four hours ago. She drank all day Saturday and also started throwing up on Saturday. Pt has tremors and HR is 150. 1L bolus administered via EMS.

## 2020-11-12 NOTE — H&P (Addendum)
History and Physical        Hospital Admission Note Date: 11/12/2020  Patient name: Pamela Morris Medical record number: [id number] Date of birth: 1976-04-23 Age: 45 y.o. Gender: female  PCP: Sandford Craze, NP   Chief Complaint    Chief Complaint  Patient presents with  . Alcohol Problem  . Tachycardia      HPI:   This is a 45 year old female with past medical history of anxiety, depression, alcohol abuse who presented to the ED with complaints of intractable nausea and vomiting since last night.  States that she felt she had her alcohol consumption under good control until this past week.  States that Monday she believes she got a GI bug from a prepared salad and stayed home from work on Monday and Tuesday with nausea, vomiting and diarrhea and began drinking in excess this past Tuesday.  Her symptoms had resolved..  States that she stopped her antidepressant in order to continue drinking.  She has been drinking a bottle of vodka daily and states she has not been leaving her house and has not been taking good care of her self or hygiene since that period of time and has felt depressed.  Admits to increasing her volume of alcohol intake on Friday and Saturday.  She has not been taking her as needed Ativan for her anxiety either.  Saturday evening she began having nausea and vomiting again and was unable to keep down any p.o. intake, prompting her to come to the ED.  States that she has had alcohol withdrawal hallucinations once about a year ago and has never had seizures.  She did go to an alcohol rehab program which helped a bit but she does not wish to go back necessarily. She would like to speak to the psychiatry team whom she hopes can help her get back on track with her depression.  ED Course: Afebrile, tachycardic, hypertensive, on room air. Notable Labs: Sodium 133, K2.8,  chloride 94, CO2 16, glucose 157, BUN 5, creatinine 1.09, AG 23, lipase 36, AST 198, ALT 179, T bili 1.5, WBC 10.4, Hb 14.5, platelets 184, alcohol undetectable, COVID-19 pending. Notable Imaging: CXR-unremarkable. Patient received Librium 100 mg, Ativan 3 mg total, Zofran, potassium, 1 L NS bolus.    Vitals:   11/12/20 0630 11/12/20 0652  BP: (!) 144/102 (!) 141/102  Pulse: (!) 134 (!) 121  Resp: 16   Temp:    SpO2: 100%      Review of Systems:  Review of Systems  All other systems reviewed and are negative.   Medical/Social/Family History   Past Medical History: Past Medical History:  Diagnosis Date  . Blood type, Rh negative   . Yeast infection     Past Surgical History:  Procedure Laterality Date  . BREAST ENHANCEMENT SURGERY  07/2014  . CESAREAN SECTION    . LAPAROSCOPIC APPENDECTOMY N/A 04/11/2016   Procedure: APPENDECTOMY LAPAROSCOPIC;  Surgeon: Violeta Gelinas, MD;  Location: [facility] OR;  Service: General;  Laterality: N/A;  . lipoma Left 2007    Medications: Prior to Admission medications   Medication Sig Start Date End Date Taking? Authorizing Provider  ALPRAZolam (XANAX) 0.5 MG tablet TAKE 1 TABLET BY MOUTH  2 TIMES DAILY AS NEEDED FOR ANXIETY Patient taking differently: Take 0.5 mg by mouth 2 (two) times daily as needed for anxiety. 11/05/20  Yes Sandford Craze, NP  Desvenlafaxine Succinate ER (PRISTIQ) 25 MG TB24 Take 25 mg by mouth daily. 04/05/20  Yes Sandford Craze, NP  ibuprofen (ADVIL) 200 MG tablet Take 400 mg by mouth every 6 (six) hours as needed for fever, headache or mild pain.   Yes [provider]  levonorgestrel (MIRENA) 20 MCG/24HR IUD 1 each by Intrauterine route once.   Yes [provider]  Multiple Vitamin (MULTIVITAMIN WITH MINERALS) TABS tablet Take 1 tablet by mouth daily.   Yes [provider]    Allergies:   Allergies  Allergen Reactions  . Lexapro [Escitalopram Oxalate] Other (See Comments)    LACK OF  EMOTION    Social History:  reports that she has quit smoking. Her smoking use included cigarettes. She has a 0.50 pack-year smoking history. She has never used smokeless tobacco. She reports current alcohol use. She reports that she does not use drugs.  Family History: Family History  Problem Relation Age of Onset  . Peptic Ulcer Father   . Hypertension Mother   . Aneurysm Mother      Objective   Physical Exam: Blood pressure (!) 141/102, pulse (!) 121, temperature 98.3 F (36.8 C), temperature source Oral, resp. rate 16, height 5\' 2"  (1.575 m), weight 69.9 kg, SpO2 100 %.  Physical Exam Vitals and nursing note reviewed.  Constitutional:      Appearance: Normal appearance.     Comments: Tremulous  HENT:     Head: Normocephalic and atraumatic.  Eyes:     Conjunctiva/sclera: Conjunctivae normal.  Cardiovascular:     Rate and Rhythm: Regular rhythm. Tachycardia present.  Pulmonary:     Effort: Pulmonary effort is normal.     Breath sounds: Normal breath sounds.  Abdominal:     General: Abdomen is flat.     Palpations: Abdomen is soft.     Tenderness: There is abdominal tenderness in the epigastric area.  Musculoskeletal:        General: No swelling or tenderness.  Skin:    Coloration: Skin is not jaundiced or pale.  Neurological:     Mental Status: She is alert. Mental status is at baseline.  Psychiatric:        Mood and Affect: Mood is depressed.        Behavior: Behavior normal.     LABS on Admission: I have personally reviewed all the labs and imaging below    Basic Metabolic Panel: Recent Labs  Lab 11/12/20 0630  NA 133*  K 2.8*  CL 94*  CO2 16*  GLUCOSE 157*  BUN <5*  CREATININE 1.09*  CALCIUM 9.0   Liver Function Tests: Recent Labs  Lab 11/12/20 0630  AST 198*  ALT 179*  ALKPHOS 77  BILITOT 1.5*  PROT 8.2*  ALBUMIN 4.7   Recent Labs  Lab 11/12/20 0630  LIPASE 36   No results for input(s): AMMONIA in the last 168  hours. CBC: Recent Labs  Lab 11/12/20 0630  WBC 10.4  NEUTROABS 8.8*  HGB 14.5  HCT 41.2  MCV 90.5  PLT 184   Cardiac Enzymes: No results for input(s): CKTOTAL, CKMB, CKMBINDEX, TROPONINI in the last 168 hours. BNP: Invalid input(s): POCBNP CBG: No results for input(s): GLUCAP in the last 168 hours.  Radiological Exams on Admission:  DG Chest Port 1 View  Result Date:  11/12/2020 CLINICAL DATA:  45 year old female with chest pain, dehydration. EXAM: PORTABLE CHEST 1 VIEW COMPARISON:  CT Abdomen and Pelvis 04/11/2016. FINDINGS: Portable AP semi upright view at 0552 hours. Normal lung volumes and mediastinal contours. Visualized tracheal air column is within normal limits. Allowing for portable technique the lungs are clear. No pneumothorax. Negative visible bowel gas pattern. No evidence of pneumoperitoneum. No osseous abnormality identified. IMPRESSION: Negative portable chest. Electronically Signed   By: Odessa Fleming M.D.   On: 11/12/2020 06:05      EKG: sinus tachycardia, prolonged QT   A & P   Principal Problem:   Alcohol withdrawal (HCC) Active Problems:   Intractable nausea and vomiting   Depression   Prolonged QT interval   Hypokalemia   High anion gap metabolic acidosis   Elevated serum creatinine   1. Alcohol withdrawal a. Last alcoholic drink was 8 PM last night b. No hallucinations, not in DT c. Continue CIWA protocol d. TOC consult  2. Intractable nausea and vomiting with GERD secondary to #1 a. Continue IV fluids b. Pepcid  c. Clear liquid diet and advance as tolerated  3. Depression a. Psychiatry consult  4. Prolonged QT a. Replete electrolytes b. Avoid QT prolonging agents  5. Hepatitis, suspect alcohol induced a. Check PT/INR b. Hold further librium c. Check acute hepatitis panel d. RUQ [place]  6. Hypokalemia a. Replete b. Check magnesium  7. Anion gap metabolic acidosis, suspect alcoholic ketoacidosis a. Continue IV fluids and electrolyte  repletion  8. Elevated creatinine a. Continue IV fluids    DVT prophylaxis: Lovenox   Code Status: Full Code  Diet: Clear liquids Family Communication: Admission, patients condition and plan of care including tests being ordered have been discussed with the patient who indicates understanding and agrees with the plan and Code Status.  Disposition Plan: The appropriate patient status for this patient is OBSERVATION. Observation status is judged to be reasonable and necessary in order to provide the required intensity of service to ensure the patient's safety. The patient's presenting symptoms, physical exam findings, and initial radiographic and laboratory data in the context of their medical condition is felt to place them at decreased risk for further clinical deterioration. Furthermore, it is anticipated that the patient will be medically stable for discharge from the hospital within 2 midnights of admission. The following factors support the patient status of observation.   " The patient's presenting symptoms include nausea and vomiting, alcohol withdrawal. " The physical exam findings include tremulous, sinus tachycardia. " The initial radiographic and laboratory data are worrisome for hepatitis.    Status is: Observation  The patient remains OBS appropriate and will d/c before 2 midnights.  Dispo: The patient is from: Home              Anticipated d/c is to: Home              Anticipated d/c date is: 1 day              Patient currently is not medically stable to d/c.   Difficult to place patient No    Consultants  . Psychiatry  Procedures  . None  Time Spent on Admission: 60 minutes    Jae Dire, DO [facility]  11/12/2020, 8:46 AM

## 2020-11-12 NOTE — Progress Notes (Signed)
PCP was notified to have the air/con precautions discontinued.

## 2020-11-12 NOTE — ED Provider Notes (Signed)
Patient signed to me by Dr. Adela Lank pending lab results.  Patient is mildly hypokalemic and IV potassium ordered.  Patient remains tachycardic and tremulous.  Alcohol level is 0.  Patient states that she drinks 1/5 of vodka a day.  Will require admission for alcohol withdrawal   Lorre Nick, MD 11/12/20 [phone]

## 2020-11-12 NOTE — ED Provider Notes (Signed)
[facility]-EMERGENCY DEPT Provider Note   CSN: [id number] Arrival date & time: 11/12/20  [id number]     History Chief Complaint  Patient presents with  . Alcohol Problem  . Tachycardia    Pamela Morris is a 45 y.o. female.  45 yo F with a chief complaints of intractable nausea and vomiting.  This been going on for the past day.  The patient had been drinking heavily prior to this starting.  Felt like her heart was racing and that she may be dehydrated.  Has not really been eating anything.  She denies fevers denies significant abdominal pain.  Feels a bit sore from persistent vomiting.  She could feel that her heart was beating fast and this made her somewhat anxious.  She thinks her vomiting is caused by thrush.  The history is provided by the patient.  Alcohol Problem This is a chronic problem. The current episode started 2 days ago. The problem occurs constantly. The problem has not changed since onset.Pertinent negatives include no chest pain, no abdominal pain, no headaches and no shortness of breath. Nothing aggravates the symptoms. Nothing relieves the symptoms. She has tried nothing for the symptoms. The treatment provided no relief.  Illness Severity:  Moderate Onset quality:  Gradual Duration:  2 days Timing:  Constant Progression:  Worsening Chronicity:  New Associated symptoms: nausea and vomiting   Associated symptoms: no abdominal pain, no chest pain, no congestion, no fever, no headaches, no myalgias, no rhinorrhea, no shortness of breath and no wheezing        Past Medical History:  Diagnosis Date  . Blood type, Rh negative   . Yeast infection     Patient Active Problem List   Diagnosis Date Noted  . Tobacco abuse 06/28/2016  . Adjustment disorder with mixed anxiety and depressed mood 05/31/2016    Past Surgical History:  Procedure Laterality Date  . BREAST ENHANCEMENT SURGERY  07/2014  . CESAREAN SECTION    . LAPAROSCOPIC  APPENDECTOMY N/A 04/11/2016   Procedure: APPENDECTOMY LAPAROSCOPIC;  Surgeon: Violeta GelinasBurke Thompson, MD;  Location[facility];  Service: General;  Laterality: N/A;  . lipoma Left 2007     OB History    Gravida  3   Para  2   Term      Preterm      AB  1   Living  2     SAB      IAB      Ectopic      Multiple      Live Births              Family History  Problem Relation Age of Onset  . Peptic Ulcer Father   . Hypertension Mother   . Aneurysm Mother     Social History   Tobacco Use  . Smoking status: Former Smoker    Packs/day: 0.25    Years: 2.00    Pack years: 0.50    Types: Cigarettes  . Smokeless tobacco: Never Used  Vaping Use  . Vaping Use: Every day  Substance Use Topics  . Alcohol use: Yes    Comment: occasiona;  Marland Kitchen. Drug use: No    Home Medications Prior to Admission medications   Medication Sig Start Date End Date Taking? Authorizing Provider  ALPRAZolam Prudy Feeler(XANAX) 0.5 MG tablet TAKE 1 TABLET BY MOUTH 2 TIMES DAILY AS NEEDED FOR ANXIETY 11/05/20   Sandford Craze'Sullivan, Melissa, NP  Desvenlafaxine Succinate ER (PRISTIQ) 25 MG TB24  Take 25 mg by mouth daily. 04/05/20   Sandford Craze, NP  levonorgestrel (MIRENA) 20 MCG/24HR IUD 1 each by Intrauterine route once.    [provider]  traZODone (DESYREL) 50 MG tablet Take 0.5-1 tablets (25-50 mg total) by mouth at bedtime as needed for sleep. 04/05/20   Sandford Craze, NP  valACYclovir (VALTREX) 500 MG tablet Take 1 tablet by mouth daily as needed.    [provider]    Allergies    Lexapro [escitalopram oxalate]  Review of Systems   Review of Systems  Constitutional: Negative for chills and fever.  HENT: Negative for congestion and rhinorrhea.   Eyes: Negative for redness and visual disturbance.  Respiratory: Negative for shortness of breath and wheezing.   Cardiovascular: Negative for chest pain and palpitations.  Gastrointestinal: Positive for nausea and vomiting. Negative for abdominal  pain.  Genitourinary: Negative for dysuria and urgency.  Musculoskeletal: Negative for arthralgias and myalgias.  Skin: Negative for pallor and wound.  Neurological: Negative for dizziness and headaches.    Physical Exam Updated Vital Signs BP (!) 141/102   Pulse (!) 121   Temp 98.3 F (36.8 C) (Oral)   Resp 16   Ht 5\' 2"  (1.575 m)   Wt 69.9 kg   SpO2 100%   BMI 28.17 kg/m   Physical Exam Vitals and nursing note reviewed.  Constitutional:      General: She is not in acute distress.    Appearance: She is well-developed and well-nourished. She is not diaphoretic.  HENT:     Head: Normocephalic and atraumatic.     Mouth/Throat:     Comments: Some whitish discoloration of the tongue that does not scrape off with a depressor Eyes:     Extraocular Movements: EOM normal.     Pupils: Pupils are equal, round, and reactive to light.  Cardiovascular:     Rate and Rhythm: Regular rhythm. Tachycardia present.     Heart sounds: No murmur heard. No friction rub. No gallop.   Pulmonary:     Effort: Pulmonary effort is normal.     Breath sounds: No wheezing or rales.  Abdominal:     General: There is no distension.     Palpations: Abdomen is soft.     Tenderness: There is no abdominal tenderness.  Musculoskeletal:        General: No tenderness or edema.     Cervical back: Normal range of motion and neck supple.  Skin:    General: Skin is warm and dry.  Neurological:     Mental Status: She is alert and oriented to person, place, and time.  Psychiatric:        Mood and Affect: Mood and affect normal.        Behavior: Behavior normal.     ED Results / Procedures / Treatments   Labs (all labs ordered are listed, but only abnormal results are displayed) Labs Reviewed  CBC WITH DIFFERENTIAL/PLATELET - Abnormal; Notable for the following components:      Result Value   Neutro Abs 8.8 (*)    Lymphs Abs 0.5 (*)    All other components within normal limits  COMPREHENSIVE  METABOLIC PANEL  LIPASE, BLOOD  ETHANOL    EKG EKG Interpretation  Date/Time:  Sunday November 12 2020 04:49:19 EST Ventricular Rate:  155 PR Interval:    QRS Duration: 71 QT Interval:  315 QTC Calculation: 506 R Axis:   94 Text Interpretation: Sinus tachycardia Ventricular premature complex Borderline  right axis deviation Repol abnrm suggests ischemia, diffuse leads Baseline wander in lead(s) III aVL aVF 12 Lead; Mason-Likar No old tracing to compare Confirmed by Melene Plan [phone]) on 11/12/2020 5:07:04 AM   Radiology DG Chest Port 1 View  Result Date: 11/12/2020 CLINICAL DATA:  45 year old female with chest pain, dehydration. EXAM: PORTABLE CHEST 1 VIEW COMPARISON:  CT Abdomen and Pelvis 04/11/2016. FINDINGS: Portable AP semi upright view at 0552 hours. Normal lung volumes and mediastinal contours. Visualized tracheal air column is within normal limits. Allowing for portable technique the lungs are clear. No pneumothorax. Negative visible bowel gas pattern. No evidence of pneumoperitoneum. No osseous abnormality identified. IMPRESSION: Negative portable chest. Electronically Signed   By: Odessa Fleming M.D.   On: 11/12/2020 06:05    Procedures Procedures   Medications Ordered in ED Medications  pantoprazole (PROTONIX) injection 40 mg (has no administration in time range)  sodium chloride 0.9 % bolus 1,000 mL (1,000 mLs Intravenous New Bag/Given 11/12/20 0541)  LORazepam (ATIVAN) injection 1 mg (1 mg Intravenous Given 11/12/20 0544)  chlordiazePOXIDE (LIBRIUM) capsule 100 mg (100 mg Oral Given 11/12/20 0620)  ondansetron (ZOFRAN) injection 4 mg (4 mg Intravenous Given 11/12/20 0549)  LORazepam (ATIVAN) injection 2 mg (2 mg Intravenous Given 11/12/20 4268)    ED Course  I have reviewed the triage vital signs and the nursing notes.  Pertinent labs & imaging results that were available during my care of the patient were reviewed by me and considered in my medical decision making (see chart  for details).    MDM Rules/Calculators/A&P                          45 yo F with a chief complaints of intractable nausea and vomiting after drinking heavily.  Patient has a history of alcohol abuse.  Significantly tachycardic on arrival into the 150s with some hypertension.  Question acute alcohol withdrawal.  Will give Ativan Librium.  Check lab work.  Bolus of IV fluids.  Some improvement, though persistent tachycardia.  Give another bolus of ativan.  Awaiting labs.    Signed out to Dr. Freida Busman, see his note for further details of care in the ED.  The patients results and plan were reviewed and discussed.   Any x-rays performed were independently reviewed by myself.   Differential diagnosis were considered with the presenting HPI.  Medications  pantoprazole (PROTONIX) injection 40 mg (has no administration in time range)  sodium chloride 0.9 % bolus 1,000 mL (1,000 mLs Intravenous New Bag/Given 11/12/20 0541)  LORazepam (ATIVAN) injection 1 mg (1 mg Intravenous Given 11/12/20 0544)  chlordiazePOXIDE (LIBRIUM) capsule 100 mg (100 mg Oral Given 11/12/20 0620)  ondansetron (ZOFRAN) injection 4 mg (4 mg Intravenous Given 11/12/20 0549)  LORazepam (ATIVAN) injection 2 mg (2 mg Intravenous Given 11/12/20 0651)    Vitals:   11/12/20 0444 11/12/20 0601 11/12/20 0630 11/12/20 0652  BP: (!) 135/109 (!) 146/106 (!) 144/102 (!) 141/102  Pulse: (!) 145 (!) 129 (!) 134 (!) 121  Resp: 18 18 16    Temp: 98.3 F (36.8 C)     TempSrc: Oral     SpO2: 98% 100% 100%   Weight: 69.9 kg     Height: 5\' 2"  (1.575 m)       Final diagnoses:  Alcohol withdrawal syndrome without complication (HCC)  Nausea and vomiting in adult      Final Clinical Impression(s) / ED Diagnoses Final diagnoses:  Alcohol withdrawal  syndrome without complication (HCC)  Nausea and vomiting in adult    Rx / DC Orders ED Discharge Orders    None       Melene Plan, DO 11/12/20 9470

## 2020-11-13 ENCOUNTER — Observation Stay (HOSPITAL_COMMUNITY): Payer: 59

## 2020-11-13 DIAGNOSIS — R112 Nausea with vomiting, unspecified: Secondary | ICD-10-CM | POA: Diagnosis present

## 2020-11-13 DIAGNOSIS — R072 Precordial pain: Secondary | ICD-10-CM | POA: Diagnosis present

## 2020-11-13 DIAGNOSIS — K219 Gastro-esophageal reflux disease without esophagitis: Secondary | ICD-10-CM | POA: Diagnosis present

## 2020-11-13 DIAGNOSIS — I361 Nonrheumatic tricuspid (valve) insufficiency: Secondary | ICD-10-CM | POA: Diagnosis not present

## 2020-11-13 DIAGNOSIS — I426 Alcoholic cardiomyopathy: Secondary | ICD-10-CM | POA: Diagnosis present

## 2020-11-13 DIAGNOSIS — K7010 Alcoholic hepatitis without ascites: Secondary | ICD-10-CM | POA: Diagnosis present

## 2020-11-13 DIAGNOSIS — Z9181 History of falling: Secondary | ICD-10-CM | POA: Diagnosis not present

## 2020-11-13 DIAGNOSIS — E876 Hypokalemia: Secondary | ICD-10-CM | POA: Diagnosis present

## 2020-11-13 DIAGNOSIS — Z79899 Other long term (current) drug therapy: Secondary | ICD-10-CM | POA: Diagnosis not present

## 2020-11-13 DIAGNOSIS — Y900 Blood alcohol level of less than 20 mg/100 ml: Secondary | ICD-10-CM | POA: Diagnosis present

## 2020-11-13 DIAGNOSIS — Z87891 Personal history of nicotine dependence: Secondary | ICD-10-CM | POA: Diagnosis not present

## 2020-11-13 DIAGNOSIS — D6959 Other secondary thrombocytopenia: Secondary | ICD-10-CM | POA: Diagnosis present

## 2020-11-13 DIAGNOSIS — R7989 Other specified abnormal findings of blood chemistry: Secondary | ICD-10-CM | POA: Diagnosis present

## 2020-11-13 DIAGNOSIS — E872 Acidosis: Secondary | ICD-10-CM | POA: Diagnosis present

## 2020-11-13 DIAGNOSIS — R000 Tachycardia, unspecified: Secondary | ICD-10-CM | POA: Diagnosis present

## 2020-11-13 DIAGNOSIS — F32A Depression, unspecified: Secondary | ICD-10-CM | POA: Diagnosis not present

## 2020-11-13 DIAGNOSIS — F410 Panic disorder [episodic paroxysmal anxiety] without agoraphobia: Secondary | ICD-10-CM | POA: Diagnosis present

## 2020-11-13 DIAGNOSIS — D61818 Other pancytopenia: Secondary | ICD-10-CM | POA: Diagnosis present

## 2020-11-13 DIAGNOSIS — F4323 Adjustment disorder with mixed anxiety and depressed mood: Secondary | ICD-10-CM | POA: Diagnosis present

## 2020-11-13 DIAGNOSIS — K760 Fatty (change of) liver, not elsewhere classified: Secondary | ICD-10-CM | POA: Diagnosis present

## 2020-11-13 DIAGNOSIS — Z20822 Contact with and (suspected) exposure to covid-19: Secondary | ICD-10-CM | POA: Diagnosis present

## 2020-11-13 DIAGNOSIS — R9431 Abnormal electrocardiogram [ECG] [EKG]: Secondary | ICD-10-CM | POA: Diagnosis present

## 2020-11-13 DIAGNOSIS — F411 Generalized anxiety disorder: Secondary | ICD-10-CM | POA: Diagnosis not present

## 2020-11-13 DIAGNOSIS — R079 Chest pain, unspecified: Secondary | ICD-10-CM | POA: Diagnosis not present

## 2020-11-13 DIAGNOSIS — E860 Dehydration: Secondary | ICD-10-CM | POA: Diagnosis present

## 2020-11-13 DIAGNOSIS — R002 Palpitations: Secondary | ICD-10-CM | POA: Diagnosis present

## 2020-11-13 DIAGNOSIS — Z793 Long term (current) use of hormonal contraceptives: Secondary | ICD-10-CM | POA: Diagnosis not present

## 2020-11-13 DIAGNOSIS — F10230 Alcohol dependence with withdrawal, uncomplicated: Secondary | ICD-10-CM | POA: Diagnosis present

## 2020-11-13 LAB — COMPREHENSIVE METABOLIC PANEL
ALT: 190 U/L — ABNORMAL HIGH (ref 0–44)
AST: 259 U/L — ABNORMAL HIGH (ref 15–41)
Albumin: 3.8 g/dL (ref 3.5–5.0)
Alkaline Phosphatase: 66 U/L (ref 38–126)
Anion gap: 11 (ref 5–15)
BUN: <5 mg/dL — ABNORMAL LOW (ref 6–20)
CO2: 27 mmol/L (ref 22–32)
Calcium: 8.6 mg/dL — ABNORMAL LOW (ref 8.9–10.3)
Chloride: 97 mmol/L — ABNORMAL LOW (ref 98–111)
Creatinine, Ser: 0.8 mg/dL (ref 0.44–1.00)
GFR, Estimated: >60 mL/min (ref >60)
Glucose, Bld: 104 mg/dL — ABNORMAL HIGH (ref 70–99)
Potassium: 3 mmol/L — ABNORMAL LOW (ref 3.5–5.1)
Sodium: 135 mmol/L (ref 135–145)
Total Bilirubin: 2.2 mg/dL — ABNORMAL HIGH (ref 0.3–1.2)
Total Protein: 6.8 g/dL (ref 6.5–8.1)

## 2020-11-13 LAB — ECHOCARDIOGRAM COMPLETE
Area-P 1/2: 3.37 cm2
Height: 62 in
S' Lateral: 3.3 cm
Weight: 2464 oz

## 2020-11-13 LAB — CBC
HCT: 34.7 % — ABNORMAL LOW (ref 36.0–46.0)
Hemoglobin: 11.9 g/dL — ABNORMAL LOW (ref 12.0–15.0)
MCH: 32.2 pg (ref 26.0–34.0)
MCHC: 34.3 g/dL (ref 30.0–36.0)
MCV: 94 fL (ref 80.0–100.0)
Platelets: 116 10*3/uL — ABNORMAL LOW (ref 150–400)
RBC: 3.69 MIL/uL — ABNORMAL LOW (ref 3.87–5.11)
RDW: 12.5 % (ref 11.5–15.5)
WBC: 4.5 10*3/uL (ref 4.0–10.5)
nRBC: 0 % (ref 0.0–0.2)

## 2020-11-13 LAB — TSH: TSH: 1.373 u[IU]/mL (ref 0.350–4.500)

## 2020-11-13 LAB — TROPONIN I (HIGH SENSITIVITY)
Troponin I (High Sensitivity): 13 ng/L (ref <18)
Troponin I (High Sensitivity): 11 ng/L (ref <18)

## 2020-11-13 LAB — PHOSPHORUS: Phosphorus: 2.1 mg/dL — ABNORMAL LOW (ref 2.5–4.6)

## 2020-11-13 LAB — MAGNESIUM: Magnesium: 2.3 mg/dL (ref 1.7–2.4)

## 2020-11-13 MED ORDER — PANTOPRAZOLE SODIUM 40 MG PO TBEC
40.0000 mg | DELAYED_RELEASE_TABLET | Freq: Every day | ORAL | Status: DC
  Administered 2020-11-13 – 2020-11-15 (×3): 40 mg via ORAL
  Filled 2020-11-13 (×3): qty 1

## 2020-11-13 MED ORDER — LOSARTAN POTASSIUM 25 MG PO TABS
25.0000 mg | ORAL_TABLET | Freq: Every day | ORAL | Status: DC
  Administered 2020-11-14 – 2020-11-15 (×2): 25 mg via ORAL
  Filled 2020-11-13 (×2): qty 1

## 2020-11-13 MED ORDER — POTASSIUM CHLORIDE CRYS ER 20 MEQ PO TBCR
40.0000 meq | EXTENDED_RELEASE_TABLET | ORAL | Status: AC
Start: 2020-11-13 — End: 2020-11-13
  Administered 2020-11-13 (×2): 40 meq via ORAL
  Filled 2020-11-13 (×2): qty 2

## 2020-11-13 MED ORDER — POTASSIUM PHOSPHATES 15 MMOLE/5ML IV SOLN
15.0000 mmol | Freq: Once | INTRAVENOUS | Status: AC
  Administered 2020-11-13: 15 mmol via INTRAVENOUS
  Filled 2020-11-13: qty 5

## 2020-11-13 MED ORDER — DESVENLAFAXINE SUCCINATE ER 25 MG PO TB24: 1.0000 | ORAL_TABLET | Freq: Every day | ORAL | Status: DC

## 2020-11-13 MED ORDER — POTASSIUM CHLORIDE CRYS ER 20 MEQ PO TBCR
20.0000 meq | EXTENDED_RELEASE_TABLET | ORAL | Status: AC
  Administered 2020-11-13 (×2): 20 meq via ORAL
  Filled 2020-11-13 (×2): qty 1

## 2020-11-13 MED ORDER — ALUM & MAG HYDROXIDE-SIMETH 200-200-20 MG/5ML PO SUSP: 15.0000 mL | Freq: Four times a day (QID) | ORAL | Status: DC | PRN

## 2020-11-13 MED ORDER — CARVEDILOL 3.125 MG PO TABS
3.1250 mg | ORAL_TABLET | Freq: Two times a day (BID) | ORAL | Status: DC
  Administered 2020-11-14 – 2020-11-15 (×2): 3.125 mg via ORAL
  Filled 2020-11-13 (×2): qty 1

## 2020-11-13 NOTE — Progress Notes (Signed)
  Echocardiogram 2D Echocardiogram has been performed.  Dannielle Baskins G Aubrynn Katona 11/13/2020, 2:22 PM

## 2020-11-13 NOTE — Consult Note (Signed)
[facility] Face-to-Face Psychiatry Consult   Reason for Consult: Alcohol use disorder admitted with alcohol withdrawal, background of active depression and anxiety.  Quit taking psychiatric medications 2 to 3 weeks ago with increased alcohol intake. Referring Physician:  Dr. Waymon Amato Patient Identification: Pamela Morris MRN:  [id number] Principal Diagnosis: Adjustment disorder with mixed anxiety and depressed mood Diagnosis:  Principal Problem:   Adjustment disorder with mixed anxiety and depressed mood Active Problems:   Alcohol withdrawal (HCC)   Intractable nausea and vomiting   Depression   Prolonged QT interval   Hypokalemia   High anion gap metabolic acidosis   Elevated serum creatinine   Total Time spent with patient: 30 minutes  Subjective:   Pamela Morris is a 45 y.o. female patient admitted with alcohol withdrawal.  Patient is seen and case is discussed with Dr. Lucianne Muss.  Patient is observed to be lying in her bed, while her nurse is attempting to establish and new IV access.  Patient reports previous diagnosis of depression and anxiety, with increasing panic attacks.  She does identify new stressors to include pandemic, working from home, decreasing in social interaction, recent separation.  She does endorse symptoms of depression at this time to include decreased appetite, isolation, withdrawn, and self-medicating with alcohol.  She reports previously taking Lexapro however made her feel as a zombie in which she discontinued.  Few weeks prior she was taking Pristiq, and Xanax as needed for panic attacks.  She reports she does not have any outpatient services at present, however was seeing Melissa L. Lendell Caprice at [facility] for medication management.  She reports her alcohol intake has increased due to the symptoms listed above."  I used to drink on the weekends, and now that has transition to week day nights.  On the weekends I would drink about 4 to 5 glasses of Christiane Ha of vodka."  She reports history of inpatient substance abuse rehabilitation at [facility] in [place].  She attended this program in May 2021, length of stay 30 days.  She does not express any interest in attending an additional inpatient substance abuse program, however she does wish to resume her psychiatric medication.  At current she denies any symptoms of anxiety, however she is observed to become tearful during the psychiatric evaluation when expressing her emotions and feelings.  She denies any suicidal thoughts, previous suicide attempts, or history of self-harm.  At current she denies any suicidal thoughts, homicidal thoughts, and or auditory visual hallucinations.  Patient blood alcohol level less than 10 on admission.  HPI: This is a 45 year old female with past medical history of anxiety, depression, alcohol abuse who presented to the ED with complaints of intractable nausea and vomiting since last night.  States that she felt she had her alcohol consumption under good control until this past week.  States that Monday she believes she got a GI bug from a prepared salad and stayed home from work on Monday and Tuesday with nausea, vomiting and diarrhea and began drinking in excess this past Tuesday.  Her symptoms had resolved..  States that she stopped her antidepressant in order to continue drinking.  She has been drinking a bottle of vodka daily and states she has not been leaving her house and has not been taking good care of her self or hygiene since that period of time and has felt depressed.  Admits to increasing her volume of alcohol intake on Friday and Saturday.  She has not been  taking her as needed Ativan for her anxiety either.  Saturday evening she began having nausea and vomiting again and was unable to keep down any p.o. intake, prompting her to come to the ED.  States that she has had alcohol withdrawal hallucinations once about a year ago and has never had seizures.  She  did go to an alcohol rehab program which helped a bit but she does not wish to go back necessarily. She would like to speak to the psychiatry team whom she hopes can help her get back on track with her depression.  Past Psychiatric History: Depression, anxiety, increase in panic attacks.  Risk to Self:  Denies Risk to Others:  Denies Prior Inpatient Therapy:  [facility] inpatient substance abuse facility, length of stay 30 days Prior Outpatient Therapy:  No current psychiatric providers  Past Medical History:  Past Medical History:  Diagnosis Date  . Blood type, Rh negative   . Yeast infection     Past Surgical History:  Procedure Laterality Date  . BREAST ENHANCEMENT SURGERY  07/2014  . CESAREAN SECTION    . LAPAROSCOPIC APPENDECTOMY N/A 04/11/2016   Procedure: APPENDECTOMY LAPAROSCOPIC;  Surgeon: Violeta Gelinas, MD;  Location: [facility] OR;  Service: General;  Laterality: N/A;  . lipoma Left 2007   Family History:  Family History  Problem Relation Age of Onset  . Peptic Ulcer Father   . Hypertension Mother   . Aneurysm Mother    Family Psychiatric  History: Denies Social History:  Social History   Substance and Sexual Activity  Alcohol Use Yes   Comment: occasiona;     Social History   Substance and Sexual Activity  Drug Use No    Social History   Socioeconomic History  . Marital status: Divorced    Spouse name: Not on file  . Number of children: Not on file  . Years of education: Not on file  . Highest education level: Not on file  Occupational History  . Not on file  Tobacco Use  . Smoking status: Former Smoker    Packs/day: 0.25    Years: 2.00    Pack years: 0.50    Types: Cigarettes  . Smokeless tobacco: Never Used  Vaping Use  . Vaping Use: Every day  Substance and Sexual Activity  . Alcohol use: Yes    Comment: occasiona;  Marland Kitchen Drug use: No  . Sexual activity: Yes    Birth control/protection: I.U.D.    Comment: Mirena X 03/2011  Other Topics  Concern  . 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 Lexapro [Escitalopram Oxalate] Other (See Comments)    LACK OF EMOTION    Labs:  Results for orders placed or performed during the hospital encounter of 11/12/20 (from the past 48 hour(s))  Magnesium     Status: Abnormal   Collection Time: 11/12/20  6:00 AM  Result  Value Ref Range   Magnesium 1.5 (L) 1.7 - 2.4 mg/dL    Comment: Performed at [facility], [address] [place], [place] [address]  Phosphorus     Status: Abnormal   Collection Time: 11/12/20  6:00 AM  Result Value Ref Range   Phosphorus 1.4 (L) 2.5 - 4.6 mg/dL    Comment: Performed at [facility], [address] [place], [place] [address]  CBC with Differential     Status: Abnormal   Collection Time: 11/12/20  6:30 AM  Result Value Ref Range   WBC 10.4 4.0 - 10.5 K/uL   RBC 4.55 3.87 - 5.11 MIL/uL   Hemoglobin 14.5 12.0 - 15.0 g/dL   HCT 09.841.2 11.936.0 - 14.746.0 %   MCV 90.5 80.0 - 100.0 fL   MCH 31.9 26.0 - 34.0 pg   MCHC 35.2 30.0 - 36.0 g/dL   RDW 82.911.8 56.211.5 - 13.015.5 %   Platelets 184 150 - 400 K/uL   nRBC 0.0 0.0 - 0.2 %   Neutrophils Relative % 85 %   Neutro Abs 8.8 (H) 1.7 - 7.7 K/uL   Lymphocytes Relative 5 %   Lymphs Abs 0.5 (L) 0.7 - 4.0 K/uL   Monocytes Relative 5 %   Monocytes Absolute 0.5 0.1 - 1.0 K/uL   Eosinophils Relative 4 %   Eosinophils Absolute 0.4 0.0 - 0.5 K/uL   Basophils Relative  0 %   Basophils Absolute 0.0 0.0 - 0.1 K/uL   Immature Granulocytes 1 %   Abs Immature Granulocytes 0.07 0.00 - 0.07 K/uL    Comment: Performed at [facility], [address] [place], [place] [address]  Comprehensive metabolic panel     Status: Abnormal   Collection Time: 11/12/20  6:30 AM  Result Value Ref Range   Sodium 133 (L) 135 - 145 mmol/L   Potassium 2.8 (L) 3.5 - 5.1 mmol/L   Chloride 94 (L) 98 - 111 mmol/L   CO2 16 (L) 22 - 32 mmol/L   Glucose, Bld 157 (H) 70 - 99 mg/dL    Comment: Glucose reference range applies only to samples taken after fasting for at least 8 hours.   BUN <5 (L) 6 - 20 mg/dL   Creatinine, Ser 4.691.09 (H) 0.44 - 1.00 mg/dL   Calcium 9.0 8.9 - 62.910.3 mg/dL   Total Protein 8.2 (H) 6.5 - 8.1 g/dL   Albumin 4.7 3.5 - 5.0 g/dL   AST 528198 (H) 15 - 41 U/L   ALT 179 (H) 0 - 44 U/L   Alkaline Phosphatase 77 38 - 126 U/L   Total Bilirubin 1.5 (H) 0.3 - 1.2 mg/dL   GFR, Estimated >41>60 >32>60 mL/min    Comment: (NOTE) Calculated using the CKD-EPI Creatinine Equation (2021)    Anion gap 23 (H) 5 - 15    Comment: REPEATED TO VERIFY Performed at [facility], [address] [place], [place] [address]   Lipase, blood     Status: None   Collection Time: 11/12/20  6:30 AM  Result Value Ref Range   Lipase 36 11 - 51 U/L    Comment: Performed at [facility], [address] [place], [place] [address]  Ethanol     Status: None   Collection Time: 11/12/20  6:45 AM  Result Value Ref Range   Alcohol, Ethyl (B) <10 <10 mg/dL    Comment: (NOTE) Lowest detectable limit for serum alcohol is 10  mg/dL.  For medical purposes only. Performed at [facility], [address], [place], [place] [address]   HIV Antibody (routine testing w rflx)     Status: None   Collection Time: 11/12/20  8:43 AM  Result Value Ref Range   HIV Screen 4th Generation wRfx Non Reactive Non Reactive    Comment: Performed at  [facility], [address] [place], [place] [address]  Hepatitis panel, acute     Status: None   Collection Time: 11/12/20 11:20 AM  Result Value Ref Range   Hepatitis B Surface Ag NON REACTIVE NON REACTIVE   HCV Ab NON REACTIVE NON REACTIVE    Comment: (NOTE) Nonreactive HCV antibody screen is consistent with no HCV infections,  unless recent infection is suspected or other evidence exists to indicate HCV infection.     Hep A IgM NON REACTIVE NON REACTIVE   Hep B C IgM NON REACTIVE NON REACTIVE    Comment: Performed at [facility], [address], [place], [place] [address]  CBC     Status: Abnormal   Collection Time: 11/12/20 11:20 AM  Result Value Ref Range   WBC 11.3 (H) 4.0 - 10.5 K/uL   RBC 4.04 3.87 - 5.11 MIL/uL   Hemoglobin 12.9 12.0 - 15.0 g/dL   HCT 91.4 78.2 - 95.6 %   MCV 89.6 80.0 - 100.0 fL   MCH 31.9 26.0 - 34.0 pg   MCHC 35.6 30.0 - 36.0 g/dL   RDW 21.3 08.6 - 57.8 %   Platelets 144 (L) 150 - 400 K/uL    Comment: REPEATED TO VERIFY   nRBC 0.0 0.0 - 0.2 %    Comment: Performed at [facility], [address] [place], [place] [address]  Resp Panel by RT-PCR (Flu A&B, Covid) Nasopharyngeal Swab     Status: None   Collection Time: 11/12/20  1:26 PM   Specimen: Nasopharyngeal Swab; Nasopharyngeal(NP) swabs in vial transport medium  Result Value Ref Range   SARS Coronavirus 2 by RT PCR NEGATIVE NEGATIVE    Comment: (NOTE) SARS-CoV-2 target nucleic acids are NOT DETECTED.  The SARS-CoV-2 RNA is generally detectable in upper respiratory specimens during the acute phase of infection. The lowest concentration of SARS-CoV-2 viral copies this assay can detect is 138 copies/mL. A negative result does not preclude SARS-Cov-2 infection and should not be used as the sole basis for treatment or other patient management decisions. A negative result may occur with  improper specimen collection/handling, submission of specimen other than  nasopharyngeal swab, presence of viral mutation(s) within the areas targeted by this assay, and inadequate number of viral copies(<138 copies/mL). A negative result must be combined with clinical observations, patient history, and epidemiological information. The expected result is Negative.  Fact Sheet for Patients:  BloggerCourse.com  Fact Sheet for Healthcare Providers:  SeriousBroker.it  This test is no t yet approved or cleared by the [place] FDA and  has been authorized for detection and/or diagnosis of SARS-CoV-2 by FDA under an Emergency Use Authorization (EUA). This EUA will remain  in effect (meaning this test can be used) for the duration of the COVID-19 declaration under Section 564(b)(1) of the Act, 21 U.S.C.section 360bbb-3(b)(1), unless the authorization is terminated  or revoked sooner.       Influenza A by PCR NEGATIVE NEGATIVE   Influenza B by PCR NEGATIVE NEGATIVE    Comment: (NOTE) The Xpert Xpress SARS-CoV-2/FLU/RSV plus assay is intended  as an aid in the diagnosis of influenza from Nasopharyngeal swab specimens and should not be used as a sole basis for treatment. Nasal washings and aspirates are unacceptable for Xpert Xpress SARS-CoV-2/FLU/RSV testing.  Fact Sheet for Patients: BloggerCourse.com  Fact Sheet for Healthcare Providers: SeriousBroker.it  This test is not yet approved or cleared by the [place] FDA and has been authorized for detection and/or diagnosis of SARS-CoV-2 by FDA under an Emergency Use Authorization (EUA). This EUA will remain in effect (meaning this test can be used) for the duration of the COVID-19 declaration under Section 564(b)(1) of the Act, 21 U.S.C. section 360bbb-3(b)(1), unless the authorization is terminated or revoked.  Performed at [facility], [address], [place], [place]  [address]   Protime-INR     Status: None   Collection Time: 11/12/20  3:16 PM  Result Value Ref Range   Prothrombin Time 13.3 11.4 - 15.2 seconds   INR 1.1 0.8 - 1.2    Comment: (NOTE) INR goal varies based on device and disease states. Performed at [facility], [address], [place], [place] [address]   Protime-INR     Status: None   Collection Time: 11/12/20  4:17 PM  Result Value Ref Range   Prothrombin Time 13.6 11.4 - 15.2 seconds   INR 1.1 0.8 - 1.2    Comment: (NOTE) INR goal varies based on device and disease states. Performed at [facility], [address], [place], [place] [address]   Magnesium     Status: None   Collection Time: 11/13/20  5:57 AM  Result Value Ref Range   Magnesium 2.3 1.7 - 2.4 mg/dL    Comment: Performed at [facility], [address] [place], [place] [address]  CBC     Status: Abnormal   Collection Time: 11/13/20  5:57 AM  Result Value Ref Range   WBC 4.5 4.0 - 10.5 K/uL   RBC 3.69 (L) 3.87 - 5.11 MIL/uL   Hemoglobin 11.9 (L) 12.0 - 15.0 g/dL   HCT 32.4 (L) 40.1 - 02.7 %   MCV 94.0 80.0 - 100.0 fL   MCH 32.2 26.0 - 34.0 pg   MCHC 34.3 30.0 - 36.0 g/dL   RDW 25.3 66.4 - 40.3 %   Platelets 116 (L) 150 - 400 K/uL    Comment: REPEATED TO VERIFY PLATELET COUNT CONFIRMED BY SMEAR SPECIMEN CHECKED FOR CLOTS Immature Platelet Fraction may be clinically indicated, consider ordering this additional test [id number]    nRBC 0.0 0.0 - 0.2 %    Comment: Performed at [facility], [address] [place], [place] [address]  Comprehensive metabolic panel     Status: Abnormal   Collection Time: 11/13/20  5:57 AM  Result Value Ref Range   Sodium 135 135 - 145 mmol/L   Potassium 3.0 (L) 3.5 - 5.1 mmol/L   Chloride 97 (L) 98 - 111 mmol/L   CO2 27 22 - 32 mmol/L   Glucose, Bld 104 (H) 70 - 99 mg/dL    Comment: Glucose reference range applies only to samples taken after  fasting for at least 8 hours.   BUN <5 (L) 6 - 20 mg/dL   Creatinine, Ser 4.33 0.44 - 1.00 mg/dL   Calcium 8.6 (L) 8.9 - 10.3 mg/dL   Total Protein 6.8 6.5 - 8.1 g/dL   Albumin 3.8 3.5 - 5.0 g/dL   AST 295 (H) 15 - 41 U/L  ALT 190 (H) 0 - 44 U/L   Alkaline Phosphatase 66 38 - 126 U/L   Total Bilirubin 2.2 (H) 0.3 - 1.2 mg/dL   GFR, Estimated >16 >10 mL/min    Comment: (NOTE) Calculated using the CKD-EPI Creatinine Equation (2021)    Anion gap 11 5 - 15    Comment: Performed at [facility], [address], [place], [place] [address]  Phosphorus     Status: Abnormal   Collection Time: 11/13/20  5:57 AM  Result Value Ref Range   Phosphorus 2.1 (L) 2.5 - 4.6 mg/dL    Comment: Performed at [facility], [address] [place], [place] [address]  Troponin I (High Sensitivity)     Status: None   Collection Time: 11/13/20 10:58 AM  Result Value Ref Range   Troponin I (High Sensitivity) 13 <18 ng/L    Comment: (NOTE) Elevated high sensitivity troponin I (hsTnI) values and significant  changes across serial measurements may suggest ACS but many other  chronic and acute conditions are known to elevate hsTnI results.  Refer to the "Links" section for chest pain algorithms and additional  guidance. Performed at [facility], [address] [place], [place] [address]     Current Facility-Administered Medications  Medication Dose Route Frequency Provider Last Rate Last Admin  . alum & mag hydroxide-simeth (MAALOX/MYLANTA) 200-200-20 MG/5ML suspension 15 mL  15 mL Oral Q6H PRN Hongalgi, Anand D, MD      . enoxaparin (LOVENOX) injection 40 mg  40 mg Subcutaneous Q24H Jae Dire, MD   40 mg at 11/12/20 2153  . folic acid (FOLVITE) tablet 1 mg  1 mg Oral Daily Jae Dire, MD   1 mg at 11/13/20 0835  . LORazepam (ATIVAN) tablet 1-4 mg  1-4 mg Oral Q1H PRN Jae Dire, MD   1 mg at 11/13/20 8295   Or  . LORazepam (ATIVAN)  injection 1-4 mg  1-4 mg Intravenous Q1H PRN Jae Dire, MD      . metoprolol tartrate (LOPRESSOR) injection 5 mg  5 mg Intravenous Q6H PRN Jae Dire, MD      . multivitamin with minerals tablet 1 tablet  1 tablet Oral Daily Jae Dire, MD   1 tablet at 11/13/20 [phone]  . pantoprazole (PROTONIX) EC tablet 40 mg  40 mg Oral Daily Hongalgi, Anand D, MD      . potassium chloride SA (KLOR-CON) CR tablet 40 mEq  40 mEq Oral Q4H Elease Etienne, MD   40 mEq at 11/13/20 0835  . potassium PHOSPHATE 15 mmol in dextrose 5 % 250 mL infusion  15 mmol Intravenous Once Lucia Gaskins, RPH 43 mL/hr at 11/13/20 0940 15 mmol at 11/13/20 0940  . prochlorperazine (COMPAZINE) injection 5-10 mg  5-10 mg Intravenous Q6H PRN Jae Dire, MD       Or  . prochlorperazine (COMPAZINE) tablet 5-10 mg  5-10 mg Oral Q6H PRN Jae Dire, MD      . sodium chloride flush (NS) 0.9 % injection 3 mL  3 mL Intravenous Q12H Jae Dire, MD   3 mL at 11/13/20 0836  . thiamine tablet 100 mg  100 mg Oral Daily Jae Dire, MD   100 mg at 11/13/20 0865   Or  . thiamine (B-1) injection 100 mg  100 mg Intravenous Daily Jae Dire, MD        Musculoskeletal: Strength &  Muscle Tone: within normal limits Gait & Station: normal Patient leans: N/A  Psychiatric Specialty Exam: Physical Exam  Review of Systems  Blood pressure (!) 125/98, pulse 96, temperature 98.6 F (37 C), resp. rate 16, height  (1.575 m), weight 69.9 kg, SpO2 99 %.Body mass index is 28.17 kg/m.  General Appearance: Fairly Groomed  Eye Contact:  Fair  Speech:  Clear and Coherent and Normal Rate  Volume:  Normal  Mood:  Depressed  Affect:  Depressed, Flat and Tearful  Thought Process:  Coherent, Goal Directed, Linear and Descriptions of Associations: Intact  Orientation:  Full (Time, Place, and Person)  Thought Content:  WDL  Suicidal Thoughts:  No  Homicidal Thoughts:  No  Memory:  Immediate;   Fair Recent;   Fair  Judgement:   Intact  Insight:  Present  Psychomotor Activity:  Normal  Concentration:  Concentration: Fair and Attention Span: Fair  Recall:  [employer] of Knowledge:  Fair  Language:  Fair  Akathisia:  No  Handed:  Right  AIMS (if indicated):     Assets:  Communication Skills Desire for Improvement Financial Resources/Insurance Intimacy Leisure Time Physical Health Social Support  ADL's:  Intact  Cognition:  WNL  Sleep:        Treatment Plan Summary: Plan Will resume previous medication.  At this present time patient can benefit from outpatient behavioral health services.  Her current condition can be managed safely in an outpatient setting, as she does express interest in attending a chemical dependency intensive outpatient program.  She appears to be motivated for treatment, and express interest in being able to get out of the home to attend groups during the week.  Patient denies suicidal ideation, and does not express any concerns for imminent safety at this time.  She does not meet criteria for involuntary commitment. -Will start Pristiq 25 mg p.o. daily. (Rx may not be on formulary) -CIWA protocol has been initiated.  Continue with alcohol detox protocol.  -Blood alcohol level less than 10 on admission, urine drug screen not obtained.  PDMP verified patient does have an active prescription for alprazolam, recently filled on November 04, 2020.  She does report compliance with this prescription. -Refer to CDIOP, AVS to reflect.   Above information has been communicated with Dr. Waymon Amato and [facility] secure chat.  Disposition: No evidence of imminent risk to self or others at present.   Patient does not meet criteria for psychiatric inpatient admission. Refer to IOP. AVS to be updated for call behavioral health outpatient, chemical dependency program.  We will also provide information for mental health [facility] for support group.  Patient also requested to become a  confidential patient will restrict access, this information was passed on to nurse as well as Licensed conveyancer.  Maryagnes Amos, FNP 11/13/2020 12:27 PM

## 2020-11-13 NOTE — Progress Notes (Signed)
PROGRESS NOTE   Pamela Morris  KGM:[id number]    DOB: 1976/08/15    DOA: 11/12/2020  PCP: Sandford Craze, NP   I have briefly reviewed patients previous medical records in [facility].  Chief Complaint  Patient presents with  . Alcohol Problem  . Tachycardia    Brief Narrative:  45 year old female with medical history of anxiety, depression, former tobacco use, ongoing vaping, alcohol use disorder, presented to the ED with complaints of intractable nausea and vomiting, inability to tolerate p.o. palpitations and chest pain that started the night prior to admission.  She reported recent acute viral GE, increased alcohol consumption, stopped taking her antidepressants/anxiolytics 2 to 3 weeks PTA.  Admitted for alcohol withdrawal, intractable nausea and vomiting, and chest pain.  Clinically improving, advancing diet.  Cardiology/Dr. Algie Coffer consulted.  Psychiatry consulted.   Assessment & Plan:  Principal Problem:   Alcohol withdrawal (HCC) Active Problems:   Adjustment disorder with mixed anxiety and depressed mood   Intractable nausea and vomiting   Depression   Prolonged QT interval   Hypokalemia   High anion gap metabolic acidosis   Elevated serum creatinine   Alcohol use disorder presented with alcohol withdrawal  Recent increase in alcohol consumption up to bottle of vodka per day.  Blood alcohol level on admission <10.  Prior history of alcohol withdrawal hallucinations about a year ago but no history of seizures.  Has been to alcohol rehab program but does not wish to return.  Last alcohol drink was at approximately 8 PM on 2/12.  Treated per CIWA protocol.  Improving.  Low CIWA scores 5 > 2 > 1.  Encouraged alcohol abstinence, follow-up with alcohol Anonymous/CSW consulted for assistance with resources, psychiatry consulted.  Intractable nausea and vomiting:  Possibly multifactorial related to alcohol intoxication, GERD, gastritis versus  others.  Ongoing heartburn symptoms, changed Pepcid to PPI, added as needed Maalox/Mylanta  Tolerating liquid diet, advance to regular diet as tolerated.  As needed antiemetics.  GERD:  PPI  High anion gap metabolic acidosis  On admission bicarbonate 16, anion gap 23.  Resolved.  Alcoholic hepatitis suspected  Acute hepatitis panel negative, HIV screen negative  RUQ ultrasound: Stable focus of decreased echogenicity in the anterior segment of the right lobe of the liver, stable compared to prior studies-per radiology suggests benign etiology and may represent small hemangioma superimposed on hepatic steatosis.  No new liver lesions evident.  Consider outpatient follow-up for the lesion noted on right lobe of the liver.  Alcohol abstinence and follow LFTs.  Hypokalemia/hypomagnesemia/hypophosphatemia  Admission labs: Potassium 2.8, phosphorus 1.4, magnesium 1.5.  Magnesium corrected to 2.3.  Aggressively replace and follow potassium and phosphorus  Thrombocytopenia  Likely related to alcohol toxic effect.  Follow CBC.  Anemia  Could be related to alcohol versus chronic disease  Follow CBC.  Chest pain  Reported almost 9 hours of precordial chest pain radiating to left upper extremity on 2/12 which eventually resolved in the ED.  Admission EKG with sinus tachycardia at 155 bpm with diffuse ST depressions.  HS 1 and x2: Negative (13 > 11).  Requested repeat EKG, not yet seen in epic.  Cardiology consulted, discussed with Dr. Algie Coffer, requesting 2D echo and await further recommendations.  Resolved without recurrence.  Palpitations  Sinus tachycardia on admission in the 150s,   likely multifactorial related to alcohol withdrawal, pain, anxiety versus others.  Resolved.  Telemetry shows sinus rhythm without arrhythmias.  Check TSH for completion.  TSH normal in 2019  Adjustment disorder with mixed anxiety and depressed mood:  No SI, HI,  AVH  Psychiatric consultation appreciated: Recommend outpatient behavioral health follow-up/CDIOP, resuming prior home medication (Pristiq), continue CIWA protocol, does not meet criteria for psychiatric inpatient admission  May resume prior home dose of Xanax at discharge.  Prolonged QTC  EKG on admission 2/13: QTC 506 ms in the context of marked hypokalemia, hypomagnesemia.  Replace and follow EKG.  Minimize QT prolonging medications.  Body mass index is 28.17 kg/m.   DVT prophylaxis: enoxaparin (LOVENOX) injection 40 mg Start: 11/12/20 2200     Code Status: Full Code Family Communication: None at bedside. Disposition:  Status is: Observation  The patient will require care spanning > 2 midnights and should be moved to inpatient because: Inpatient level of care appropriate due to severity of illness  Dispo: The patient is from: Home              Anticipated d/c is to: Home              Anticipated d/c date is: 1 day              Patient currently is not medically stable to d/c.   Difficult to place patient No        Consultants:   Psychiatry  Procedures:   None  Antimicrobials:    Anti-infectives (From admission, onward)   None        Subjective:  Feels better.  Tolerating liquid diet without further nausea or vomiting.  However having heartburns.  No further precordial chest pain.  Reports minimal left shoulder pain, worse on lying on that side or movements.  Reports fall approximately 2 weeks ago.  Denies SI, HI or AVH.  Objective:   Vitals:   11/12/20 2004 11/12/20 2100 11/12/20 2344 11/13/20 0852  BP: 122/83 133/87 116/86 (!) 125/98  Pulse: (!) 102 99 96 96  Resp: 20  16   Temp: 98.1 F (36.7 C)  98.6 F (37 C)   TempSrc: Oral     SpO2: 99%  99%   Weight:      Height:        General exam: Pleasant young female, moderately built and overweight sitting up comfortably in bed without distress. Respiratory system: Clear to auscultation.  Respiratory effort normal. Cardiovascular system: S1 & S2 heard, RRR. No JVD, murmurs, rubs, gallops or clicks. No pedal edema.  Telemetry personally reviewed: Sinus rhythm. Gastrointestinal system: Abdomen is nondistended, soft and nontender. No organomegaly or masses felt. Normal bowel sounds heard. Central nervous system: Alert and oriented. No focal neurological deficits. Extremities: Symmetric 5 x 5 power. Skin: No rashes, lesions or ulcers Psychiatry: Judgement and insight appear normal. Mood & affect appropriate.     Data Reviewed:   I have personally reviewed following labs and imaging studies   CBC: Recent Labs  Lab 11/12/20 0630 11/12/20 1120 11/13/20 0557  WBC 10.4 11.3* 4.5  NEUTROABS 8.8*  --   --   HGB 14.5 12.9 11.9*  HCT 41.2 36.2 34.7*  MCV 90.5 89.6 94.0  PLT 184 144* 116*    Basic Metabolic Panel: Recent Labs  Lab 11/12/20 0600 11/12/20 0630 11/13/20 0557  NA  --  133* 135  K  --  2.8* 3.0*  CL  --  94* 97*  CO2  --  16* 27  GLUCOSE  --  157* 104*  BUN  --  <5* <5*  CREATININE  --  1.09* 0.80  CALCIUM  --  9.0 8.6*  MG 1.5*  --  2.3  PHOS 1.4*  --  2.1*    Liver Function Tests: Recent Labs  Lab 11/12/20 0630 11/13/20 0557  AST 198* 259*  ALT 179* 190*  ALKPHOS 77 66  BILITOT 1.5* 2.2*  PROT 8.2* 6.8  ALBUMIN 4.7 3.8    CBG: No results for input(s): GLUCAP in the last 168 hours.  Microbiology Studies:   Recent Results (from the past 240 hour(s))  Resp Panel by RT-PCR (Flu A&B, Covid) Nasopharyngeal Swab     Status: None   Collection Time: 11/12/20  1:26 PM   Specimen: Nasopharyngeal Swab; Nasopharyngeal(NP) swabs in vial transport medium  Result Value Ref Range Status   SARS Coronavirus 2 by RT PCR NEGATIVE NEGATIVE Final    Comment: (NOTE) SARS-CoV-2 target nucleic acids are NOT DETECTED.  The SARS-CoV-2 RNA is generally detectable in upper respiratory specimens during the acute phase of infection. The lowest concentration  of SARS-CoV-2 viral copies this assay can detect is 138 copies/mL. A negative result does not preclude SARS-Cov-2 infection and should not be used as the sole basis for treatment or other patient management decisions. A negative result may occur with  improper specimen collection/handling, submission of specimen other than nasopharyngeal swab, presence of viral mutation(s) within the areas targeted by this assay, and inadequate number of viral copies(<138 copies/mL). A negative result must be combined with clinical observations, patient history, and epidemiological information. The expected result is Negative.  Fact Sheet for Patients:  BloggerCourse.com  Fact Sheet for Healthcare Providers:  SeriousBroker.it  This test is no t yet approved or cleared by the [place] FDA and  has been authorized for detection and/or diagnosis of SARS-CoV-2 by FDA under an Emergency Use Authorization (EUA). This EUA will remain  in effect (meaning this test can be used) for the duration of the COVID-19 declaration under Section 564(b)(1) of the Act, 21 U.S.C.section 360bbb-3(b)(1), unless the authorization is terminated  or revoked sooner.       Influenza A by PCR NEGATIVE NEGATIVE Final   Influenza B by PCR NEGATIVE NEGATIVE Final    Comment: (NOTE) The Xpert Xpress SARS-CoV-2/FLU/RSV plus assay is intended as an aid in the diagnosis of influenza from Nasopharyngeal swab specimens and should not be used as a sole basis for treatment. Nasal washings and aspirates are unacceptable for Xpert Xpress SARS-CoV-2/FLU/RSV testing.  Fact Sheet for Patients: BloggerCourse.com  Fact Sheet for Healthcare Providers: SeriousBroker.it  This test is not yet approved or cleared by the [place] FDA and has been authorized for detection and/or diagnosis of SARS-CoV-2 by FDA under an Emergency Use  Authorization (EUA). This EUA will remain in effect (meaning this test can be used) for the duration of the COVID-19 declaration under Section 564(b)(1) of the Act, 21 U.S.C. section 360bbb-3(b)(1), unless the authorization is terminated or revoked.  Performed at [facility], [address] [place], [place] [address]      Radiology Studies:  [facility] Chest [place] 1 View  Result Date: 11/12/2020 CLINICAL DATA:  45 year old female with chest pain, dehydration. EXAM: PORTABLE CHEST 1 VIEW COMPARISON:  CT Abdomen and Pelvis 04/11/2016. FINDINGS: Portable AP semi upright view at 0552 hours. Normal lung volumes and mediastinal contours. Visualized tracheal air column is within normal limits. Allowing for portable technique the lungs are clear. No pneumothorax. Negative visible bowel gas pattern. No evidence of pneumoperitoneum. No osseous abnormality identified. IMPRESSION: Negative portable chest. Electronically Signed   By: Rexene Edison  Margo Aye M.D.   On: 11/12/2020 06:05   US Abdomen Limited RUQ (LIVER/GB)  Result Date: 11/12/2020 CLINICAL DATA:  Elevated liver enzymes EXAM: ULTRASOUND ABDOMEN LIMITED RIGHT UPPER QUADRANT COMPARISON:  Abdominal ultrasound August 01, 2018; CT abdomen and pelvis April 11, 2016 FINDINGS: Gallbladder: No gallstones or wall thickening visualized. There is no pericholecystic fluid. No sonographic Murphy sign noted by sonographer. Common bile duct: Diameter: 3 mm. No intrahepatic or extrahepatic biliary duct dilatation. Liver: Hypoechoic focus in the anterior segment right lobe of the liver measures 1.7 x 1.8 x 1.0 cm, present on prior CT and ultrasound examination without evident change. No new liver lesions are appreciable. Liver echogenicity overall is increased, a stable finding from prior study. Portal vein is patent on color Doppler imaging with normal direction of blood flow towards the liver. Other: None. IMPRESSION: 1. Stable focus of decreased echogenicity in the  anterior segment right lobe of the liver, stable compared to prior studies. Stability over time is indicative of benign etiology. This lesion may well represent a small hemangioma superimposed on hepatic steatosis. 2. No new liver lesions evident. Note that there is overall increased liver echogenicity, a pattern indicative of hepatic steatosis. Note that the sensitivity of ultrasound for detection of focal liver lesions is somewhat diminished in this circumstance. 3.  Study otherwise unremarkable. Electronically Signed   By: Bretta Bang III M.D.   On: 11/12/2020 10:32     Scheduled Meds:   . [START ON 11/14/2020] Desvenlafaxine Succinate ER  1 tablet Oral Daily  . enoxaparin (LOVENOX) injection  40 mg Subcutaneous Q24H  . folic acid  1 mg Oral Daily  . multivitamin with minerals  1 tablet Oral Daily  . pantoprazole  40 mg Oral Daily  . sodium chloride flush  3 mL Intravenous Q12H  . thiamine  100 mg Oral Daily   Or  . thiamine  100 mg Intravenous Daily    Continuous Infusions:   . potassium PHOSPHATE IVPB (in mmol) 15 mmol (11/13/20 0940)     LOS: 0 days     Marcellus Scott, MD, [place], [facility]. [facility]    To contact the attending provider between 7A-7P or the covering provider during after hours 7P-7A, please log into the web site [email or web address] and access using universal [place] password for that web site. If you do not have the password, please call the hospital operator.  11/13/2020, 2:20 PM

## 2020-11-13 NOTE — Consult Note (Signed)
Referring Physician: Gregary Signs  Pamela Morris is an 45 y.o. female.                       Chief Complaint: Chest pain  HPI: 45 years old female with PMH of anxiety, depression, tobacco use disorder. Vapping, alcohol use disorder had intractable nausea and vomiting along with chest pain. Her EKG showed sinus tachycardia with diffuse ST-T changes. Her troponin I levels are normal. Her echocardiogram showed mild to moderate anterior and septal wall hypokinesia with 45-50 % EF, mild diastolic dysfunction.   Past Medical History:  Diagnosis Date  . Blood type, Rh negative   . Yeast infection       Past Surgical History:  Procedure Laterality Date  . BREAST ENHANCEMENT SURGERY  07/2014  . CESAREAN SECTION    . LAPAROSCOPIC APPENDECTOMY N/A 04/11/2016   Procedure: APPENDECTOMY LAPAROSCOPIC;  Surgeon: Violeta Gelinas, MD;  Location: [facility] OR;  Service: General;  Laterality: N/A;  . lipoma Left 2007    Family History  Problem Relation Age of Onset  . Peptic Ulcer Father   . Hypertension Mother   . Aneurysm Mother    Social History:  reports that she has quit smoking. Her smoking use included cigarettes. She has a 0.50 pack-year smoking history. She has never used smokeless tobacco. She reports current alcohol use. She reports that she does not use drugs.  Allergies:  Allergies  Allergen Reactions  . Lexapro [Escitalopram Oxalate] Other (See Comments)    LACK OF EMOTION    Medications Prior to Admission  Medication Sig Dispense Refill  . ALPRAZolam (XANAX) 0.5 MG tablet TAKE 1 TABLET BY MOUTH 2 TIMES DAILY AS NEEDED FOR ANXIETY (Patient taking differently: Take 0.5 mg by mouth 2 (two) times daily as needed for anxiety.) 30 tablet 0  . Desvenlafaxine Succinate ER (PRISTIQ) 25 MG TB24 Take 25 mg by mouth daily. 90 tablet 1  . ibuprofen (ADVIL) 200 MG tablet Take 400 mg by mouth every 6 (six) hours as needed for fever, headache or mild pain.    Marland Kitchen levonorgestrel (MIRENA) 20 MCG/24HR  IUD 1 each by Intrauterine route once.    . Multiple Vitamin (MULTIVITAMIN WITH MINERALS) TABS tablet Take 1 tablet by mouth daily.      Results for orders placed or performed during the hospital encounter of 11/12/20 (from the past 48 hour(s))  Magnesium     Status: Abnormal   Collection Time: 11/12/20  6:00 AM  Result Value Ref Range   Magnesium 1.5 (L) 1.7 - 2.4 mg/dL    Comment: Performed at [facility], [address], [place], [place] [address]  Phosphorus     Status: Abnormal   Collection Time: 11/12/20  6:00 AM  Result Value Ref Range   Phosphorus 1.4 (L) 2.5 - 4.6 mg/dL    Comment: Performed at [facility], [address], [place], [place] [address]  CBC with Differential     Status: Abnormal   Collection Time: 11/12/20  6:30 AM  Result Value Ref Range   WBC 10.4 4.0 - 10.5 K/uL   RBC 4.55 3.87 - 5.11 MIL/uL   Hemoglobin 14.5 12.0 - 15.0 g/dL   HCT 09.8 11.9 - 14.7 %   MCV 90.5 80.0 - 100.0 fL   MCH 31.9 26.0 - 34.0 pg   MCHC 35.2 30.0 - 36.0 g/dL   RDW 82.9 56.2 - 13.0 %   Platelets 184 150 - 400  K/uL   nRBC 0.0 0.0 - 0.2 %   Neutrophils Relative % 85 %   Neutro Abs 8.8 (H) 1.7 - 7.7 K/uL   Lymphocytes Relative 5 %   Lymphs Abs 0.5 (L) 0.7 - 4.0 K/uL   Monocytes Relative 5 %   Monocytes Absolute 0.5 0.1 - 1.0 K/uL   Eosinophils Relative 4 %   Eosinophils Absolute 0.4 0.0 - 0.5 K/uL   Basophils Relative 0 %   Basophils Absolute 0.0 0.0 - 0.1 K/uL   Immature Granulocytes 1 %   Abs Immature Granulocytes 0.07 0.00 - 0.07 K/uL    Comment: Performed at [facility], [address] [place], [place] [address]  Comprehensive metabolic panel     Status: Abnormal   Collection Time: 11/12/20  6:30 AM  Result Value Ref Range   Sodium 133 (L) 135 - 145 mmol/L   Potassium 2.8 (L) 3.5 - 5.1 mmol/L   Chloride 94 (L) 98 - 111 mmol/L   CO2 16 (L) 22 - 32 mmol/L   Glucose, Bld 157 (H) 70 - 99 mg/dL    Comment: Glucose  reference range applies only to samples taken after fasting for at least 8 hours.   BUN <5 (L) 6 - 20 mg/dL   Creatinine, Ser 6.04 (H) 0.44 - 1.00 mg/dL   Calcium 9.0 8.9 - 54.0 mg/dL   Total Protein 8.2 (H) 6.5 - 8.1 g/dL   Albumin 4.7 3.5 - 5.0 g/dL   AST 981 (H) 15 - 41 U/L   ALT 179 (H) 0 - 44 U/L   Alkaline Phosphatase 77 38 - 126 U/L   Total Bilirubin 1.5 (H) 0.3 - 1.2 mg/dL   GFR, Estimated >19 >14 mL/min    Comment: (NOTE) Calculated using the CKD-EPI Creatinine Equation (2021)    Anion gap 23 (H) 5 - 15    Comment: REPEATED TO VERIFY Performed at [facility], [address], [place], [place] [address]   Lipase, blood     Status: None   Collection Time: 11/12/20  6:30 AM  Result Value Ref Range   Lipase 36 11 - 51 U/L    Comment: Performed at [facility], [address], [place], [place] [address]  Ethanol     Status: None   Collection Time: 11/12/20  6:45 AM  Result Value Ref Range   Alcohol, Ethyl (B) <10 <10 mg/dL    Comment: (NOTE) Lowest detectable limit for serum alcohol is 10 mg/dL.  For medical purposes only. Performed at [facility], [address], [place], [place] [address]   HIV Antibody (routine testing w rflx)     Status: None   Collection Time: 11/12/20  8:43 AM  Result Value Ref Range   HIV Screen 4th Generation wRfx Non Reactive Non Reactive    Comment: Performed at [facility], [address] [place], [place] [address]  Hepatitis panel, acute     Status: None   Collection Time: 11/12/20 11:20 AM  Result Value Ref Range   Hepatitis B Surface Ag NON REACTIVE NON REACTIVE   HCV Ab NON REACTIVE NON REACTIVE    Comment: (NOTE) Nonreactive HCV antibody screen is consistent with no HCV infections,  unless recent infection is suspected or other evidence exists to indicate HCV infection.     Hep A IgM NON REACTIVE NON REACTIVE   Hep B C IgM NON REACTIVE NON REACTIVE    Comment:  Performed  at [facility], [address], [place], [place] [address]  CBC     Status: Abnormal   Collection Time: 11/12/20 11:20 AM  Result Value Ref Range   WBC 11.3 (H) 4.0 - 10.5 K/uL   RBC 4.04 3.87 - 5.11 MIL/uL   Hemoglobin 12.9 12.0 - 15.0 g/dL   HCT 60.4 54.0 - 98.1 %   MCV 89.6 80.0 - 100.0 fL   MCH 31.9 26.0 - 34.0 pg   MCHC 35.6 30.0 - 36.0 g/dL   RDW 19.1 47.8 - 29.5 %   Platelets 144 (L) 150 - 400 K/uL    Comment: REPEATED TO VERIFY   nRBC 0.0 0.0 - 0.2 %    Comment: Performed at [facility], [address], [place], [place] [address]  Resp Panel by RT-PCR (Flu A&B, Covid) Nasopharyngeal Swab     Status: None   Collection Time: 11/12/20  1:26 PM   Specimen: Nasopharyngeal Swab; Nasopharyngeal(NP) swabs in vial transport medium  Result Value Ref Range   SARS Coronavirus 2 by RT PCR NEGATIVE NEGATIVE    Comment: (NOTE) SARS-CoV-2 target nucleic acids are NOT DETECTED.  The SARS-CoV-2 RNA is generally detectable in upper respiratory specimens during the acute phase of infection. The lowest concentration of SARS-CoV-2 viral copies this assay can detect is 138 copies/mL. A negative result does not preclude SARS-Cov-2 infection and should not be used as the sole basis for treatment or other patient management decisions. A negative result may occur with  improper specimen collection/handling, submission of specimen other than nasopharyngeal swab, presence of viral mutation(s) within the areas targeted by this assay, and inadequate number of viral copies(<138 copies/mL). A negative result must be combined with clinical observations, patient history, and epidemiological information. The expected result is Negative.  Fact Sheet for Patients:  BloggerCourse.com  Fact Sheet for Healthcare Providers:  SeriousBroker.it  This test is no t yet approved or cleared by the [place] FDA and  has been  authorized for detection and/or diagnosis of SARS-CoV-2 by FDA under an Emergency Use Authorization (EUA). This EUA will remain  in effect (meaning this test can be used) for the duration of the COVID-19 declaration under Section 564(b)(1) of the Act, 21 U.S.C.section 360bbb-3(b)(1), unless the authorization is terminated  or revoked sooner.       Influenza A by PCR NEGATIVE NEGATIVE   Influenza B by PCR NEGATIVE NEGATIVE    Comment: (NOTE) The Xpert Xpress SARS-CoV-2/FLU/RSV plus assay is intended as an aid in the diagnosis of influenza from Nasopharyngeal swab specimens and should not be used as a sole basis for treatment. Nasal washings and aspirates are unacceptable for Xpert Xpress SARS-CoV-2/FLU/RSV testing.  Fact Sheet for Patients: BloggerCourse.com  Fact Sheet for Healthcare Providers: SeriousBroker.it  This test is not yet approved or cleared by the [place] FDA and has been authorized for detection and/or diagnosis of SARS-CoV-2 by FDA under an Emergency Use Authorization (EUA). This EUA will remain in effect (meaning this test can be used) for the duration of the COVID-19 declaration under Section 564(b)(1) of the Act, 21 U.S.C. section 360bbb-3(b)(1), unless the authorization is terminated or revoked.  Performed at [facility], [address], [place], [place] [address]   Protime-INR     Status: None   Collection Time: 11/12/20  3:16 PM  Result Value Ref Range   Prothrombin Time 13.3 11.4 - 15.2 seconds   INR 1.1 0.8 - 1.2  Comment: (NOTE) INR goal varies based on device and disease states. Performed at [facility], [address] [place], [place] [address]   Protime-INR     Status: None   Collection Time: 11/12/20  4:17 PM  Result Value Ref Range   Prothrombin Time 13.6 11.4 - 15.2 seconds   INR 1.1 0.8 - 1.2    Comment: (NOTE) INR goal varies based on  device and disease states. Performed at [facility], [address] [place], [place] [address]   Magnesium     Status: None   Collection Time: 11/13/20  5:57 AM  Result Value Ref Range   Magnesium 2.3 1.7 - 2.4 mg/dL    Comment: Performed at [facility], [address], [place], [place] [address]  CBC     Status: Abnormal   Collection Time: 11/13/20  5:57 AM  Result Value Ref Range   WBC 4.5 4.0 - 10.5 K/uL   RBC 3.69 (L) 3.87 - 5.11 MIL/uL   Hemoglobin 11.9 (L) 12.0 - 15.0 g/dL   HCT 78.6 (L) 76.7 - 20.9 %   MCV 94.0 80.0 - 100.0 fL   MCH 32.2 26.0 - 34.0 pg   MCHC 34.3 30.0 - 36.0 g/dL   RDW 47.0 96.2 - 83.6 %   Platelets 116 (L) 150 - 400 K/uL    Comment: REPEATED TO VERIFY PLATELET COUNT CONFIRMED BY SMEAR SPECIMEN CHECKED FOR CLOTS Immature Platelet Fraction may be clinically indicated, consider ordering this additional test [id number]    nRBC 0.0 0.0 - 0.2 %    Comment: Performed at [facility], [address], [place], [place] [address]  Comprehensive metabolic panel     Status: Abnormal   Collection Time: 11/13/20  5:57 AM  Result Value Ref Range   Sodium 135 135 - 145 mmol/L   Potassium 3.0 (L) 3.5 - 5.1 mmol/L   Chloride 97 (L) 98 - 111 mmol/L   CO2 27 22 - 32 mmol/L   Glucose, Bld 104 (H) 70 - 99 mg/dL    Comment: Glucose reference range applies only to samples taken after fasting for at least 8 hours.   BUN <5 (L) 6 - 20 mg/dL   Creatinine, Ser 4.65 0.44 - 1.00 mg/dL   Calcium 8.6 (L) 8.9 - 10.3 mg/dL   Total Protein 6.8 6.5 - 8.1 g/dL   Albumin 3.8 3.5 - 5.0 g/dL   AST 681 (H) 15 - 41 U/L   ALT 190 (H) 0 - 44 U/L   Alkaline Phosphatase 66 38 - 126 U/L   Total Bilirubin 2.2 (H) 0.3 - 1.2 mg/dL   GFR, Estimated >27 >51 mL/min    Comment: (NOTE) Calculated using the CKD-EPI Creatinine Equation (2021)    Anion gap 11 5 - 15    Comment: Performed at [facility], [address] [place], [place] [address]  Phosphorus     Status: Abnormal   Collection Time: 11/13/20  5:57 AM  Result Value Ref Range   Phosphorus 2.1 (L) 2.5 - 4.6 mg/dL    Comment: Performed at [facility], [address], [place] , [place] [address]  Troponin I (High Sensitivity)     Status: None   Collection Time: 11/13/20 10:58 AM  Result Value Ref Range   Troponin I (High Sensitivity) 13 <18 ng/L    Comment: (NOTE) Elevated high sensitivity troponin I (hsTnI) values and significant  changes across serial measurements  may suggest ACS but many other  chronic and acute conditions are known to elevate hsTnI results.  Refer to the "Links" section for chest pain algorithms and additional  guidance. Performed at [facility], [address], [place], [place] [address]   Troponin I (High Sensitivity)     Status: None   Collection Time: 11/13/20 12:31 PM  Result Value Ref Range   Troponin I (High Sensitivity) 11 <18 ng/L    Comment: (NOTE) Elevated high sensitivity troponin I (hsTnI) values and significant  changes across serial measurements may suggest ACS but many other  chronic and acute conditions are known to elevate hsTnI results.  Refer to the "Links" section for chest pain algorithms and additional  guidance. Performed at [facility], [address], [place], [place] [address]   TSH     Status: None   Collection Time: 11/13/20  2:52 PM  Result Value Ref Range   TSH 1.373 0.350 - 4.500 uIU/mL    Comment: Performed by a 3rd Generation assay with a functional sensitivity of <=0.01 uIU/mL. Performed at [facility], [address] [place], [place] [address]    DG Chest Port 1 View  Result Date: 11/12/2020 CLINICAL DATA:  45 year old female with chest pain, dehydration. EXAM: PORTABLE CHEST 1 VIEW COMPARISON:  CT Abdomen and Pelvis 04/11/2016. FINDINGS: Portable AP semi upright view at 0552 hours. Normal lung  volumes and mediastinal contours. Visualized tracheal air column is within normal limits. Allowing for portable technique the lungs are clear. No pneumothorax. Negative visible bowel gas pattern. No evidence of pneumoperitoneum. No osseous abnormality identified. IMPRESSION: Negative portable chest. Electronically Signed   By: Odessa Fleming M.D.   On: 11/12/2020 06:05   ECHOCARDIOGRAM COMPLETE  Result Date: 11/13/2020    ECHOCARDIOGRAM REPORT   Patient Name:   DANARIA LARSEN Date of Exam: 11/13/2020 Medical Rec #:  [id number]          Height:       62.0 in Accession #:    [id number]         Weight:       154.0 lb Date of Birth:  1976-05-10          BSA:          1.711 m Patient Age:    44 years           BP:           125/98 mmHg Patient Gender: F                  HR:           97 bpm. Exam Location:  Inpatient Procedure: 2D Echo, Cardiac Doppler and Color Doppler Indications:     R07.9* Chest pain, unspecified  History:         Patient has no prior history of Echocardiogram examinations.  Sonographer:     Elmarie Shiley Dance Referring Phys:  [id number] [name] Diagnosing Phys: [name] MD IMPRESSIONS  1. Left ventricular ejection fraction, by estimation, is 45 to 50%. The left ventricle has mildly decreased function. The left ventricle demonstrates regional wall motion abnormalities (see scoring diagram/findings for description). Left ventricular diastolic parameters are consistent with Grade I diastolic dysfunction (impaired relaxation). There is moderate hypokinesis of the left ventricular, entire anterior wall. There is mild hypokinesis of the left ventricular, entire septal wall.  2. Right ventricular systolic function is mildly reduced. The right ventricular size is normal.  3. Left atrial size was mildly dilated.  4. The mitral valve is normal in structure. Trivial mitral valve regurgitation. No evidence of mitral stenosis.  5. The aortic valve is tricuspid. There is mild calcification of the aortic valve. There  is mild thickening of the aortic valve. Aortic valve regurgitation is not visualized.  6. The inferior vena cava is normal in size with greater than 50% respiratory variability, suggesting right atrial pressure of 3 mmHg. FINDINGS  Left Ventricle: Left ventricular ejection fraction, by estimation, is 45 to 50%. The left ventricle has mildly decreased function. The left ventricle demonstrates regional wall motion abnormalities. Moderate hypokinesis of the left ventricular, entire anterior wall. Mild hypokinesis of the left ventricular, entire septal wall. The left ventricular internal cavity size was normal in size. There is no left ventricular hypertrophy. Left ventricular diastolic parameters are consistent with Grade I diastolic dysfunction (impaired relaxation).  LV Wall Scoring: The entire anterior wall and entire septum are hypokinetic. The entire lateral wall, entire inferior wall, and apex are normal. Right Ventricle: The right ventricular size is normal. No increase in right ventricular wall thickness. Right ventricular systolic function is mildly reduced. Left Atrium: Left atrial size was mildly dilated. Right Atrium: Right atrial size was normal in size. Pericardium: There is no evidence of pericardial effusion. Mitral Valve: The mitral valve is normal in structure. Trivial mitral valve regurgitation. No evidence of mitral valve stenosis. Tricuspid Valve: The tricuspid valve is normal in structure. Tricuspid valve regurgitation is mild. Aortic Valve: The aortic valve is tricuspid. There is mild calcification of the aortic valve. There is mild thickening of the aortic valve. Aortic valve regurgitation is not visualized. Pulmonic Valve: The pulmonic valve was normal in structure. Pulmonic valve regurgitation is not visualized. Aorta: The aortic root is normal in size and structure. Venous: The inferior vena cava is normal in size with greater than 50% respiratory variability, suggesting right atrial pressure  of 3 mmHg. IAS/Shunts: The interatrial septum was not well visualized.  LEFT VENTRICLE PLAX 2D LVIDd:         4.30 cm  Diastology LVIDs:         3.30 cm  LV e' medial:    8.38 cm/s LV PW:         1.00 cm  LV E/e' medial:  7.1 LV IVS:        0.70 cm  LV e' lateral:   9.79 cm/s LVOT diam:     1.80 cm  LV E/e' lateral: 6.1 LV SV:         43 LV SV Index:   25 LVOT Area:     2.54 cm  RIGHT VENTRICLE             IVC RV Basal diam:  2.40 cm     IVC diam: 1.30 cm RV S prime:     12.40 cm/s TAPSE (M-mode): 1.6 cm LEFT ATRIUM             Index       RIGHT ATRIUM          Index LA diam:        2.90 cm 1.70 cm/m  RA Area:     9.82 cm LA Vol (A2C):   36.3 ml 21.22 ml/m RA Volume:   16.70 ml 9.76 ml/m LA Vol (A4C):   26.6 ml 15.55 ml/m LA Biplane Vol: 32.0 ml 18.71 ml/m  AORTIC VALVE LVOT Vmax:   98.30 cm/s LVOT Vmean:  70.000  cm/s LVOT VTI:    0.169 m  AORTA Ao Root diam: 3.00 cm Ao Asc diam:  2.70 cm MITRAL VALVE MV Area (PHT): 3.37 cm    SHUNTS MV Decel Time: 225 msec    Systemic VTI:  0.17 m MV E velocity: 59.90 cm/s  Systemic Diam: 1.80 cm MV A velocity: 66.00 cm/s MV E/A ratio:  0.91 [name] MD Electronically signed by [name] MD Signature Date/Time: 11/13/2020/6:04:04 PM    Final    US Abdomen Limited RUQ (LIVER/GB)  Result Date: 11/12/2020 CLINICAL DATA:  Elevated liver enzymes EXAM: ULTRASOUND ABDOMEN LIMITED RIGHT UPPER QUADRANT COMPARISON:  Abdominal ultrasound August 01, 2018; CT abdomen and pelvis April 11, 2016 FINDINGS: Gallbladder: No gallstones or wall thickening visualized. There is no pericholecystic fluid. No sonographic Murphy sign noted by sonographer. Common bile duct: Diameter: 3 mm. No intrahepatic or extrahepatic biliary duct dilatation. Liver: Hypoechoic focus in the anterior segment right lobe of the liver measures 1.7 x 1.8 x 1.0 cm, present on prior CT and ultrasound examination without evident change. No new liver lesions are appreciable. Liver echogenicity overall is increased,  a stable finding from prior study. Portal vein is patent on color Doppler imaging with normal direction of blood flow towards the liver. Other: None. IMPRESSION: 1. Stable focus of decreased echogenicity in the anterior segment right lobe of the liver, stable compared to prior studies. Stability over time is indicative of benign etiology. This lesion may well represent a small hemangioma superimposed on hepatic steatosis. 2. No new liver lesions evident. Note that there is overall increased liver echogenicity, a pattern indicative of hepatic steatosis. Note that the sensitivity of ultrasound for detection of focal liver lesions is somewhat diminished in this circumstance. 3.  Study otherwise unremarkable. Electronically Signed   By: Bretta Bang III M.D.   On: 11/12/2020 10:32    Review Of Systems Constitutional: No fever, chills, weight loss or gain. Eyes: No vision change, wears glasses. No discharge or pain. Ears: No hearing loss, No tinnitus. Respiratory: No asthma, COPD, pneumonias. Positive shortness of breath. No hemoptysis. Cardiovascular: Positive chest pain, palpitation, leg edema. Gastrointestinal: Positive nausea, vomiting, diarrhea, no constipation. No GI bleed. No hepatitis. Genitourinary: No dysuria, hematuria, kidney stone. No incontinance. Neurological: No headache, stroke, seizures.  Psychiatry: Positive psych facility admission for anxiety, depression, suicide or detox. Skin: No rash. Musculoskeletal: No joint pain, fibromyalgia. No neck pain, back pain. Lymphadenopathy: No lymphadenopathy. Hematology: No anemia or easy bruising.   Blood pressure 108/72, pulse 87, temperature 98.9 F (37.2 C), temperature source Oral, resp. rate 16, height 5\' 2"  (1.575 m), weight 69.9 kg, SpO2 99 %. Body mass index is 28.17 kg/m. General appearance: alert, cooperative, appears stated age and no distress Head: Normocephalic, atraumatic. Eyes: Hazel eyes, pink conjunctiva, corneas clear.  PERRL, EOM's intact. Neck: No adenopathy, no carotid bruit, no JVD, supple, symmetrical, trachea midline and thyroid not enlarged. Resp: Clear to auscultation bilaterally. Cardio: Regular rate and rhythm, S1, S2 normal, II/VI systolic murmur, no click, rub or gallop GI: Soft, non-tender; bowel sounds normal; no organomegaly. Extremities: No edema, cyanosis or clubbing. Skin: Warm and dry.  Neurologic: Alert and oriented X 3, normal strength.   Assessment/Plan Chest pain  Cardiomyopathy r/o ischemia Alcohol use disorder Anxiety Tachycardia Hypokalemia Hypomagnesemia, improved  Plan: Discuss right and left heart catheterization. Potassium supplement. Add small dose Beta-blocker and ARB as tolerated.  Time spent: Review of old records, Lab, x-rays, EKG, other cardiac tests, examination, discussion with  patient/Doctor/Nurse/ over 70 minutes.  Ricki Rodriguez, MD  11/13/2020, 6:23 PM

## 2020-11-13 NOTE — TOC Initial Note (Signed)
Transition of Care [facility]) - Initial/Assessment Note    Patient Details  Name: Pamela Morris MRN: [id number] Date of Birth: 1976-06-27  Transition of Care [facility]) CM/SW Contact:    Armanda Heritage, RN Phone Number: 11/13/2020, 3:05 PM  Clinical Narrative:                 CM spoke with patient regarding substance use resources.  Patient provided with list of AA meetings including web site to access most current meeting times, as well as  outpatient and inpatient treatment options.   Expected Discharge Plan: Home/Self Care Barriers to Discharge: Continued Medical Work up   Patient Goals and CMS Choice Patient states their goals for this hospitalization and ongoing recovery are:: to go home      Expected Discharge Plan and Services Expected Discharge Plan: Home/Self Care   Discharge Planning Services: CM Consult   Living arrangements for the past 2 months: Single Family Home                                      Prior Living Arrangements/Services Living arrangements for the past 2 months: Single Family Home   Patient language and need for interpreter reviewed:: Yes Do you feel safe going back to the place where you live?: Yes            Criminal Activity/Legal Involvement Pertinent to Current Situation/Hospitalization: No - Comment as needed  Activities of Daily Living Home Assistive Devices/Equipment: None ADL Screening (condition at time of admission) Patient's cognitive ability adequate to safely complete daily activities?: Yes Is the patient deaf or have difficulty hearing?: No Does the patient have difficulty seeing, even when wearing glasses/contacts?: No Does the patient have difficulty concentrating, remembering, or making decisions?: No Patient able to express need for assistance with ADLs?: Yes Does the patient have difficulty dressing or bathing?: No Independently performs ADLs?: Yes (appropriate for developmental age) Does the patient have  difficulty walking or climbing stairs?: No Weakness of Legs: None Weakness of Arms/Hands: None  Permission Sought/Granted                  Emotional Assessment Appearance:: Appears stated age Attitude/Demeanor/Rapport: Engaged Affect (typically observed): Accepting Orientation: : Oriented to Self,Oriented to Place,Oriented to  Time,Oriented to Situation   Psych Involvement: No (comment)  Admission diagnosis:  Alcohol withdrawal (HCC) [F10.239] Elevated LFTs [R79.89] Nausea and vomiting in adult [R11.2] Alcohol withdrawal syndrome without complication (HCC) [F10.230] Patient Active Problem List   Diagnosis Date Noted  . Alcohol withdrawal (HCC) 11/12/2020  . Intractable nausea and vomiting 11/12/2020  . Depression 11/12/2020  . Prolonged QT interval 11/12/2020  . Hypokalemia 11/12/2020  . High anion gap metabolic acidosis 11/12/2020  . Tobacco abuse 06/28/2016  . Adjustment disorder with mixed anxiety and depressed mood 05/31/2016   PCP:  Sandford Craze, NP Pharmacy:   [facility] - [place], [place] - [address] [place] [address] [place] [address] Phone: [phone] Fax: [phone]     Social Determinants of Health (SDOH) Interventions    Readmission Risk Interventions No flowsheet data found.

## 2020-11-13 NOTE — Progress Notes (Signed)
Pharmacy: Phosphorus replacement  Patient is a 45 y.o F with hx EtOH presented to the ED on 2/13 with c/o intractable n/v. Pharmacy has been consulted to replace phosphorus.  Today, 11/13/2020: - K 3, Na 135 - Phos up from 1.4 to 2.1 s/p  Kphos 30 mmol x1 on 2/13) - scr 0.80 (crcl~81)  Plan: - potassium phosphate 15 mmol IV x1 - phos level on 2/15  Dorna Leitz, PharmD, BCPS 11/13/2020 7:47 AM

## 2020-11-14 ENCOUNTER — Ambulatory Visit (HOSPITAL_COMMUNITY)
Admit: 2020-11-14 | Discharge: 2020-11-14 | Disposition: A | Discharge status: Home / Self Care | Payer: 59 | Source: Ambulatory Visit | Attending: Cardiovascular Disease | Admitting: Cardiovascular Disease

## 2020-11-14 DIAGNOSIS — R072 Precordial pain: Secondary | ICD-10-CM | POA: Insufficient documentation

## 2020-11-14 DIAGNOSIS — R079 Chest pain, unspecified: Secondary | ICD-10-CM | POA: Diagnosis not present

## 2020-11-14 LAB — COMPREHENSIVE METABOLIC PANEL
ALT: 317 U/L — ABNORMAL HIGH (ref 0–44)
AST: 506 U/L — ABNORMAL HIGH (ref 15–41)
Albumin: 3.8 g/dL (ref 3.5–5.0)
Alkaline Phosphatase: 72 U/L (ref 38–126)
Anion gap: 7 (ref 5–15)
BUN: 6 mg/dL (ref 6–20)
CO2: 28 mmol/L (ref 22–32)
Calcium: 8.6 mg/dL — ABNORMAL LOW (ref 8.9–10.3)
Chloride: 101 mmol/L (ref 98–111)
Creatinine, Ser: 0.8 mg/dL (ref 0.44–1.00)
GFR, Estimated: >60 mL/min (ref >60)
Glucose, Bld: 114 mg/dL — ABNORMAL HIGH (ref 70–99)
Potassium: 3.8 mmol/L (ref 3.5–5.1)
Sodium: 136 mmol/L (ref 135–145)
Total Bilirubin: 1.7 mg/dL — ABNORMAL HIGH (ref 0.3–1.2)
Total Protein: 6.6 g/dL (ref 6.5–8.1)

## 2020-11-14 LAB — CBC
HCT: 34.9 % — ABNORMAL LOW (ref 36.0–46.0)
Hemoglobin: 11.6 g/dL — ABNORMAL LOW (ref 12.0–15.0)
MCH: 32 pg (ref 26.0–34.0)
MCHC: 33.2 g/dL (ref 30.0–36.0)
MCV: 96.4 fL (ref 80.0–100.0)
Platelets: 103 10*3/uL — ABNORMAL LOW (ref 150–400)
RBC: 3.62 MIL/uL — ABNORMAL LOW (ref 3.87–5.11)
RDW: 12.5 % (ref 11.5–15.5)
WBC: 3.5 10*3/uL — ABNORMAL LOW (ref 4.0–10.5)
nRBC: 0 % (ref 0.0–0.2)

## 2020-11-14 LAB — PHOSPHORUS: Phosphorus: 2.4 mg/dL — ABNORMAL LOW (ref 2.5–4.6)

## 2020-11-14 MED ORDER — TECHNETIUM TC 99M TETROFOSMIN IV KIT
32.0000 | PACK | Freq: Once | INTRAVENOUS | Status: AC | PRN
  Administered 2020-11-14: 32 via INTRAVENOUS

## 2020-11-14 MED ORDER — REGADENOSON 0.4 MG/5ML IV SOLN: 0.4000 mg | Freq: Once | INTRAVENOUS | Status: AC

## 2020-11-14 MED ORDER — REGADENOSON 0.4 MG/5ML IV SOLN
INTRAVENOUS | Status: AC
  Administered 2020-11-14: 0.4 mg via INTRAVENOUS
  Filled 2020-11-14: qty 5

## 2020-11-14 MED ORDER — IBUPROFEN 200 MG PO TABS
400.0000 mg | ORAL_TABLET | Freq: Once | ORAL | Status: AC
  Administered 2020-11-14: 400 mg via ORAL
  Filled 2020-11-14: qty 2

## 2020-11-14 MED ORDER — POTASSIUM PHOSPHATES 15 MMOLE/5ML IV SOLN
10.0000 mmol | Freq: Once | INTRAVENOUS | Status: AC
  Administered 2020-11-14: 10 mmol via INTRAVENOUS
  Filled 2020-11-14: qty 3.33

## 2020-11-14 MED ORDER — TECHNETIUM TC 99M TETROFOSMIN IV KIT
11.0000 | PACK | Freq: Once | INTRAVENOUS | Status: AC | PRN
  Administered 2020-11-14: 11 via INTRAVENOUS

## 2020-11-14 NOTE — Progress Notes (Signed)
Pharmacy: Phosphorus replacement  Patient is a 45 y.o F with hx EtOH presented to the ED on 2/13 with c/o intractable n/v. Pharmacy has been consulted to replace phosphorus.  - 2/13: phos 1.4 --> Kphos 30 mmol IV x1 - 2/14: K 3, Na 135, phos 2.1--> Kphos 15 mmol IV x1 - 2/15: K 3.8, Na 136, phos up 2.4   Plan: - potassium phosphate 10 mmol IV x1 - phos level on 2/16  Dorna Leitz, PharmD, BCPS 11/14/2020 9:20 AM

## 2020-11-14 NOTE — Progress Notes (Signed)
Pt  tolerated procedure well.

## 2020-11-14 NOTE — Consult Note (Signed)
Nuclear stress test is negative for reversible ischemia. 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  [name], MD 11/14/2020,  3:34 PM

## 2020-11-14 NOTE — Plan of Care (Signed)

## 2020-11-14 NOTE — Progress Notes (Signed)
PROGRESS NOTE   Pamela Morris  ZOX:[id number]    DOB: [birth year]    DOA: 11/12/2020  PCP: Sandford Craze, NP   I have briefly reviewed patients previous medical records in [facility].  Chief Complaint  Patient presents with  . Alcohol Problem  . Tachycardia    Brief Narrative:  45 year old female with medical history of anxiety, depression, former tobacco use, ongoing vaping, alcohol use disorder, presented to the ED with complaints of intractable nausea and vomiting, inability to tolerate p.o. palpitations and chest pain that started the night prior to admission.  She reported recent acute viral GE, increased alcohol consumption, stopped taking her antidepressants/anxiolytics 2 to 3 weeks PTA.  Admitted for alcohol withdrawal, intractable nausea and vomiting, and chest pain.  Clinically improving, advancing diet.  Cardiology/Dr. Algie Coffer consulted.  Psychiatry consulted.  S/p low risk stress test 2/15.  Possible DC home 2/16.   Assessment & Plan:  Principal Problem:   Alcohol withdrawal (HCC) Active Problems:   Adjustment disorder with mixed anxiety and depressed mood   Intractable nausea and vomiting   Depression   Prolonged QT interval   Hypokalemia   High anion gap metabolic acidosis   Alcohol use disorder presented with alcohol withdrawal  Recent increase in alcohol consumption up to bottle of vodka per day.  Blood alcohol level on admission <10.  Prior history of alcohol withdrawal hallucinations about a year ago but no history of seizures.  Has been to alcohol rehab program but does not wish to return.  Last alcohol drink was at approximately 8 PM on 2/12.  Treated per CIWA protocol.  Improving.  Low CIWA scores 1 > 3  Encouraged alcohol abstinence, follow-up with alcohol Anonymous/CSW consulted for assistance with resources, psychiatry consulted.  Intractable nausea and vomiting:  Possibly multifactorial related to alcohol intoxication, GERD,  gastritis versus others.  Ongoing heartburn symptoms, changed Pepcid to PPI, added as needed Maalox/Mylanta  Tolerating liquid diet, advance to regular diet as tolerated.  As needed antiemetics.  No further nausea or vomiting.  Tolerating diet.  GERD:  PPI, recommend continuing at discharge.  High anion gap metabolic acidosis  On admission bicarbonate 16, anion gap 23.  Resolved.  Alcoholic hepatitis suspected  Acute hepatitis panel negative, HIV screen negative  RUQ ultrasound: Stable focus of decreased echogenicity in the anterior segment of the right lobe of the liver, stable compared to prior studies-per radiology suggests benign etiology and may represent small hemangioma superimposed on hepatic steatosis.  No new liver lesions evident.  Consider outpatient follow-up for the lesion noted on right lobe of the liver.  Alcohol abstinence and follow LFTs.  Slightly worse transaminitis.  Reassess LFTs in a.m.  Hypokalemia/hypomagnesemia/hypophosphatemia  Admission labs: Potassium 2.8, phosphorus 1.4, magnesium 1.5.  Potassium 3.8, magnesium 2.3.  Phosphorus 2.4, replace and follow.  Pancytopenia  Likely due to alcohol toxic effects.  Follow CBC in a.m.  Chest pain  Reported almost 9 hours of precordial chest pain radiating to left upper extremity on 2/12 which eventually resolved in the ED.  Admission EKG with sinus tachycardia at 155 bpm with diffuse ST depressions.  HS 1 and x2: Negative (13 > 11).    Not ACS.  No recurrence of chest pain.  Underwent nuclear stress test 2/15: No reversible ischemia or infarction, mild generalized hypokinesia, LVEF 47%.  Low risk.  Nonischemic cardiomyopathy,?  Alcoholic.  2D echo 2/14: LVEF 45-50%, LV has mildly decreased function, LV demonstrated regional wall motion abnormality and grade 1  diastolic dysfunction.  Cardiology follow-up appreciated.  Low risk stress test, suspecting alcoholic cardiomyopathy.  Cardiology  have initiated her on beta-blockers and ARB, continue.  Outpatient follow-up with Dr. Algie Coffer.  Palpitations  Sinus tachycardia on admission in the 150s,   likely multifactorial related to alcohol withdrawal, pain, anxiety versus others.  Resolved.  Telemetry shows sinus rhythm without arrhythmias.  TSH 1.373.  Remains in sinus rhythm on telemetry  Adjustment disorder with mixed anxiety and depressed mood:  No SI, HI, AVH  Psychiatric consultation appreciated: Recommend outpatient behavioral health follow-up/CDIOP, resuming prior home medication (Pristiq), continue CIWA protocol, does not meet criteria for psychiatric inpatient admission  May resume prior home dose of Xanax at discharge.  Prolonged QTC  EKG on admission 2/13: QTC 506 ms in the context of marked hypokalemia, hypomagnesemia.  EKG 2/14: QTC 450 ms  Minimize QT prolonging medications.  Follow EKG in a.m. 2/16  Body mass index is 28.17 kg/m.   DVT prophylaxis: enoxaparin (LOVENOX) injection 40 mg Start: 11/12/20 2200     Code Status: Full Code Family Communication: None at bedside. Disposition:  Status is: Observation  The patient will require care spanning > 2 midnights and should be moved to inpatient because: Inpatient level of care appropriate due to severity of illness  Dispo: The patient is from: Home              Anticipated d/c is to: Home              Anticipated d/c date is: 1 day pending further clinical improvement and improvement in her LFTs.              Patient currently is not medically stable to d/c.   Difficult to place patient No        Consultants:   Psychiatry Cardiology  Procedures:   None  Antimicrobials:    Anti-infectives (From admission, onward)   None        Subjective:  Seen after she returned from stress test.  No recurrence of chest pain.  No shoulder pain.  States that she had a panic attack last night thinking about the upcoming stress test today.   Tolerating diet without nausea or vomiting.  Heartburn better.  Objective:   Vitals:   11/13/20 1839 11/14/20 0041 11/14/20 0541 11/14/20 1254  BP: (!) 111/91 122/81 116/74 104/87  Pulse: (!) 104 88 84 88  Resp: Temp: 98.8 F (37.1 C) 98.1 F (36.7 C) 98 F (36.7 C) 98.2 F (36.8 C)  TempSrc: Oral Oral Oral Oral  SpO2: 97% 99% 97% 100%  Weight:      Height:        General exam: Pleasant young female, moderately built and overweight sitting up comfortably in bed without distress.  Appears to be in good spirits. Respiratory system: Clear to auscultation.  No increased work of breathing. Cardiovascular system: S1 & S2 heard, RRR. No JVD, murmurs, rubs, gallops or clicks. No pedal edema.  Telemetry personally reviewed: Sinus rhythm.  Occasional mild sinus tachycardia. Gastrointestinal system: Abdomen is nondistended, soft and nontender. No organomegaly or masses felt. Normal bowel sounds heard. Central nervous system: Alert and oriented. No focal neurological deficits. Extremities: Symmetric 5 x 5 power. Skin: No rashes, lesions or ulcers Psychiatry: Judgement and insight appear normal. Mood & affect appropriate.     Data Reviewed:   I have personally reviewed following labs and imaging studies   CBC: Recent Labs  Lab 11/12/20  0630 11/12/20 1120 11/13/20 0557 11/14/20 0529  WBC 10.4 11.3* 4.5 3.5*  NEUTROABS 8.8*  --   --   --   HGB 14.5 12.9 11.9* 11.6*  HCT 41.2 36.2 34.7* 34.9*  MCV 90.5 89.6 94.0 96.4  PLT 184 144* 116* 103*    Basic Metabolic Panel: Recent Labs  Lab 11/12/20 0600 11/12/20 0630 11/13/20 0557 11/14/20 0529  NA  --  133* 135 136  K  --  2.8* 3.0* 3.8  CL  --  94* 97* 101  CO2  --  16* 27 28  GLUCOSE  --  157* 104* 114*  BUN  --  <5* <5* 6  CREATININE  --  1.09* 0.80 0.80  CALCIUM  --  9.0 8.6* 8.6*  MG 1.5*  --  2.3  --   PHOS 1.4*  --  2.1* 2.4*    Liver Function Tests: Recent Labs  Lab 11/12/20 0630 11/13/20 0557  11/14/20 0529  AST 198* 259* 506*  ALT 179* 190* 317*  ALKPHOS 77 66 72  BILITOT 1.5* 2.2* 1.7*  PROT 8.2* 6.8 6.6  ALBUMIN 4.7 3.8 3.8    CBG: No results for input(s): GLUCAP in the last 168 hours.  Microbiology Studies:   Recent Results (from the past 240 hour(s))  Resp Panel by RT-PCR (Flu A&B, Covid) Nasopharyngeal Swab     Status: None   Collection Time: 11/12/20  1:26 PM   Specimen: Nasopharyngeal Swab; Nasopharyngeal(NP) swabs in vial transport medium  Result Value Ref Range Status   SARS Coronavirus 2 by RT PCR NEGATIVE NEGATIVE Final    Comment: (NOTE) SARS-CoV-2 target nucleic acids are NOT DETECTED.  The SARS-CoV-2 RNA is generally detectable in upper respiratory specimens during the acute phase of infection. The lowest concentration of SARS-CoV-2 viral copies this assay can detect is 138 copies/mL. A negative result does not preclude SARS-Cov-2 infection and should not be used as the sole basis for treatment or other patient management decisions. A negative result may occur with  improper specimen collection/handling, submission of specimen other than nasopharyngeal swab, presence of viral mutation(s) within the areas targeted by this assay, and inadequate number of viral copies(<138 copies/mL). A negative result must be combined with clinical observations, patient history, and epidemiological information. The expected result is Negative.  Fact Sheet for Patients:  BloggerCourse.comhttps://www.fda.gov/media/152166/download  Fact Sheet for Healthcare Providers:  SeriousBroker.ithttps://www.fda.gov/media/152162/download  This test is no t yet approved or cleared by the [place] FDA and  has been authorized for detection and/or diagnosis of SARS-CoV-2 by FDA under an Emergency Use Authorization (EUA). This EUA will remain  in effect (meaning this test can be used) for the duration of the COVID-19 declaration under Section 564(b)(1) of the Act, 21 U.S.C.section 360bbb-3(b)(1), unless the  authorization is terminated  or revoked sooner.       Influenza A by PCR NEGATIVE NEGATIVE Final   Influenza B by PCR NEGATIVE NEGATIVE Final    Comment: (NOTE) The Xpert Xpress SARS-CoV-2/FLU/RSV plus assay is intended as an aid in the diagnosis of influenza from Nasopharyngeal swab specimens and should not be used as a sole basis for treatment. Nasal washings and aspirates are unacceptable for Xpert Xpress SARS-CoV-2/FLU/RSV testing.  Fact Sheet for Patients: BloggerCourse.comhttps://www.fda.gov/media/152166/download  Fact Sheet for Healthcare Providers: SeriousBroker.ithttps://www.fda.gov/media/152162/download  This test is not yet approved or cleared by the [place] FDA and has been authorized for detection and/or diagnosis of SARS-CoV-2 by FDA under an Emergency Use Authorization (EUA). This EUA will  remain in effect (meaning this test can be used) for the duration of the COVID-19 declaration under Section 564(b)(1) of the Act, 21 U.S.C. section 360bbb-3(b)(1), unless the authorization is terminated or revoked.  Performed at [facility], [address], [place], [place] [address]      Radiology Studies:  NM Myocar Multi W/Spect W/Wall Motion / EF  Result Date: 11/14/2020 CLINICAL DATA:  Chest pain EXAM: MYOCARDIAL IMAGING WITH SPECT (REST AND PHARMACOLOGIC-STRESS) GATED LEFT VENTRICULAR WALL MOTION STUDY LEFT VENTRICULAR EJECTION FRACTION TECHNIQUE: Standard myocardial SPECT imaging was performed after resting intravenous injection of 11 mCi Tc-19m tetrofosmin. Subsequently, intravenous infusion of Lexiscan was performed under the supervision of the Cardiology staff. At peak effect of the drug, 32 mCi Tc-60m tetrofosmin was injected intravenously and standard myocardial SPECT imaging was performed. Quantitative gated imaging was also performed to evaluate left ventricular wall motion, and estimate left ventricular ejection fraction. COMPARISON:  None. FINDINGS: Perfusion: No  decreased activity in the left ventricle on stress imaging to suggest reversible ischemia or infarction. Wall Motion: Mild generalized hypokinesia. Left Ventricular Ejection Fraction: 47 % End diastolic volume 65 ml End systolic volume 34 ml IMPRESSION: 1. No reversible ischemia or infarction. 2. Mild generalized hypokinesia. 3. Left ventricular ejection fraction 47% 4. Non invasive risk stratification*: Low *2012 Appropriate Use Criteria for Coronary Revascularization Focused Update: J Am Coll Cardiol. 2012;59(9):857-881. http://content.dementiazones.com.aspx?articleid=[email or web address] Electronically Signed   By: Charlett Nose M.D.   On: 11/14/2020 12:29   ECHOCARDIOGRAM COMPLETE  Result Date: 11/13/2020    ECHOCARDIOGRAM REPORT   Patient Name:   Pamela Morris Date of Exam: 11/13/2020 Medical Rec #:  [id number]          Height:       62.0 in Accession #:    [id number]         Weight:       154.0 lb Date of Birth:  June 25, 1976          BSA:          1.711 m Patient Age:    44 years           BP:           125/98 mmHg Patient Gender: F                  HR:           97 bpm. Exam Location:  Inpatient Procedure: 2D Echo, Cardiac Doppler and Color Doppler Indications:     R07.9* Chest pain, unspecified  History:         Patient has no prior history of Echocardiogram examinations.  Sonographer:     Elmarie Shiley Dance Referring Phys:  [id number] [name] Diagnosing Phys: [name] MD IMPRESSIONS  1. Left ventricular ejection fraction, by estimation, is 45 to 50%. The left ventricle has mildly decreased function. The left ventricle demonstrates regional wall motion abnormalities (see scoring diagram/findings for description). Left ventricular diastolic parameters are consistent with Grade I diastolic dysfunction (impaired relaxation). There is moderate hypokinesis of the left ventricular, entire anterior wall. There is mild hypokinesis of the left ventricular, entire septal wall.  2. Right ventricular systolic function is  mildly reduced. The right ventricular size is normal.  3. Left atrial size was mildly dilated.  4. The mitral valve is normal in structure. Trivial mitral valve regurgitation. No evidence of mitral stenosis.  5. The aortic valve is tricuspid. There is mild calcification of the aortic valve. There is mild  thickening of the aortic valve. Aortic valve regurgitation is not visualized.  6. The inferior vena cava is normal in size with greater than 50% respiratory variability, suggesting right atrial pressure of 3 mmHg. FINDINGS  Left Ventricle: Left ventricular ejection fraction, by estimation, is 45 to 50%. The left ventricle has mildly decreased function. The left ventricle demonstrates regional wall motion abnormalities. Moderate hypokinesis of the left ventricular, entire anterior wall. Mild hypokinesis of the left ventricular, entire septal wall. The left ventricular internal cavity size was normal in size. There is no left ventricular hypertrophy. Left ventricular diastolic parameters are consistent with Grade I diastolic dysfunction (impaired relaxation).  LV Wall Scoring: The entire anterior wall and entire septum are hypokinetic. The entire lateral wall, entire inferior wall, and apex are normal. Right Ventricle: The right ventricular size is normal. No increase in right ventricular wall thickness. Right ventricular systolic function is mildly reduced. Left Atrium: Left atrial size was mildly dilated. Right Atrium: Right atrial size was normal in size. Pericardium: There is no evidence of pericardial effusion. Mitral Valve: The mitral valve is normal in structure. Trivial mitral valve regurgitation. No evidence of mitral valve stenosis. Tricuspid Valve: The tricuspid valve is normal in structure. Tricuspid valve regurgitation is mild. Aortic Valve: The aortic valve is tricuspid. There is mild calcification of the aortic valve. There is mild thickening of the aortic valve. Aortic valve regurgitation is not  visualized. Pulmonic Valve: The pulmonic valve was normal in structure. Pulmonic valve regurgitation is not visualized. Aorta: The aortic root is normal in size and structure. Venous: The inferior vena cava is normal in size with greater than 50% respiratory variability, suggesting right atrial pressure of 3 mmHg. IAS/Shunts: The interatrial septum was not well visualized.  LEFT VENTRICLE PLAX 2D LVIDd:         4.30 cm  Diastology LVIDs:         3.30 cm  LV e' medial:    8.38 cm/s LV PW:         1.00 cm  LV E/e' medial:  7.1 LV IVS:        0.70 cm  LV e' lateral:   9.79 cm/s LVOT diam:     1.80 cm  LV E/e' lateral: 6.1 LV SV:         43 LV SV Index:   25 LVOT Area:     2.54 cm  RIGHT VENTRICLE             IVC RV Basal diam:  2.40 cm     IVC diam: 1.30 cm RV S prime:     12.40 cm/s TAPSE (M-mode): 1.6 cm LEFT ATRIUM             Index       RIGHT ATRIUM          Index LA diam:        2.90 cm 1.70 cm/m  RA Area:     9.82 cm LA Vol (A2C):   36.3 ml 21.22 ml/m RA Volume:   16.70 ml 9.76 ml/m LA Vol (A4C):   26.6 ml 15.55 ml/m LA Biplane Vol: 32.0 ml 18.71 ml/m  AORTIC VALVE LVOT Vmax:   98.30 cm/s LVOT Vmean:  70.000 cm/s LVOT VTI:    0.169 m  AORTA Ao Root diam: 3.00 cm Ao Asc diam:  2.70 cm MITRAL VALVE MV Area (PHT): 3.37 cm    SHUNTS MV Decel Time: 225 msec    Systemic VTI:  0.17  m MV E velocity: 59.90 cm/s  Systemic Diam: 1.80 cm MV A velocity: 66.00 cm/s MV E/A ratio:  0.91 [name] MD Electronically signed by [name] MD Signature Date/Time: 11/13/2020/6:04:04 PM    Final      Scheduled Meds:   . carvedilol  3.125 mg Oral BID WC  . Desvenlafaxine Succinate ER  1 tablet Oral Daily  . enoxaparin (LOVENOX) injection  40 mg Subcutaneous Q24H  . folic acid  1 mg Oral Daily  . losartan  25 mg Oral Daily  . multivitamin with minerals  1 tablet Oral Daily  . pantoprazole  40 mg Oral Daily  . sodium chloride flush  3 mL Intravenous Q12H  . thiamine  100 mg Oral Daily   Or  . thiamine  100 mg  Intravenous Daily    Continuous Infusions:   . potassium PHOSPHATE IVPB (in mmol) 10 mmol (11/14/20 1544)     LOS: 1 day     Marcellus Scott, MD, [place], [facility]. [facility]    To contact the attending provider between 7A-7P or the covering provider during after hours 7P-7A, please log into the web site [email or web address] and access using universal [place] password for that web site. If you do not have the password, please call the hospital operator.  11/14/2020, 4:11 PM

## 2020-11-14 NOTE — Consult Note (Signed)
Ref: Pamela Craze, NP   Subjective:  Mild LV systolic dysfunction with EF 45 to 50 %. No chest pain today. Positive anxiety. Lorazepam given prior to transfer to [facility] for stress test. VS stable.  Objective:  Vital Signs in the last 24 hours: Temp:  [98 F (36.7 C)-98.9 F (37.2 C)] 98 F (36.7 C) (02/15 0541) Pulse Rate:  [84-104] 84 (02/15 0541) Cardiac Rhythm: Normal sinus rhythm (02/15 0726) Resp:  [16-20] 18 (02/15 0541) BP: (108-125)/(72-98) 116/74 (02/15 0541) SpO2:  [97 %-99 %] 97 % (02/15 0541)  Physical Exam: BP Readings from Last 1 Encounters:  11/14/20 116/74     Wt Readings from Last 1 Encounters:  11/12/20 69.9 kg    Weight change:  Body mass index is 28.17 kg/m. HEENT: [place]/AT, Eyes-Hazel, Conjunctiva-Pink, Sclera-Non-icteric Neck: No JVD, No bruit, Trachea midline. Lungs:  Clear, Bilateral. Cardiac:  Regular rhythm, normal S1 and S2, no S3. II/VI systolic murmur. Abdomen:  Soft, non-tender. BS present. Extremities:  No edema present. No cyanosis. No clubbing. CNS: AxOx3, Cranial nerves grossly intact, moves all 4 extremities.  Skin: Warm and dry.   Intake/Output from previous day: 02/14 0701 - 02/15 0700 In: 516.1 [P.O.:240; IV Piggyback:276.1] Out: 1 [Urine:1]    Lab Results: BMET    Component Value Date/Time   NA 136 11/14/2020 0529   NA 135 11/13/2020 0557   NA 133 (L) 11/12/2020 0630   K 3.8 11/14/2020 0529   K 3.0 (L) 11/13/2020 0557   K 2.8 (L) 11/12/2020 0630   CL 101 11/14/2020 0529   CL 97 (L) 11/13/2020 0557   CL 94 (L) 11/12/2020 0630   CO2 28 11/14/2020 0529   CO2 27 11/13/2020 0557   CO2 16 (L) 11/12/2020 0630   GLUCOSE 114 (H) 11/14/2020 0529   GLUCOSE 104 (H) 11/13/2020 0557   GLUCOSE 157 (H) 11/12/2020 0630   BUN 6 11/14/2020 0529   BUN <5 (L) 11/13/2020 0557   BUN <5 (L) 11/12/2020 0630   CREATININE 0.80 11/14/2020 0529   CREATININE 0.80 11/13/2020 0557   CREATININE 1.09 (H) 11/12/2020 0630   CREATININE 0.73  07/26/2020 1245   CALCIUM 8.6 (L) 11/14/2020 0529   CALCIUM 8.6 (L) 11/13/2020 0557   CALCIUM 9.0 11/12/2020 0630   GFRNONAA >60 11/14/2020 0529   GFRNONAA >60 11/13/2020 0557   GFRNONAA >60 11/12/2020 0630   GFRAA >60 04/10/2016 2140   CBC    Component Value Date/Time   WBC 3.5 (L) 11/14/2020 0529   RBC 3.62 (L) 11/14/2020 0529   HGB 11.6 (L) 11/14/2020 0529   HCT 34.9 (L) 11/14/2020 0529   PLT 103 (L) 11/14/2020 0529   MCV 96.4 11/14/2020 0529   MCH 32.0 11/14/2020 0529   MCHC 33.2 11/14/2020 0529   RDW 12.5 11/14/2020 0529   LYMPHSABS 0.5 (L) 11/12/2020 0630   MONOABS 0.5 11/12/2020 0630   EOSABS 0.4 11/12/2020 0630   BASOSABS 0.0 11/12/2020 0630   HEPATIC Function Panel Recent Labs    11/12/20 0630 11/13/20 0557 11/14/20 0529  PROT 8.2* 6.8 6.6   HEMOGLOBIN A1C No components found for: HGA1C,  MPG CARDIAC ENZYMES No results found for: CKTOTAL, CKMB, CKMBINDEX, TROPONINI BNP No results for input(s): PROBNP in the last 8760 hours. TSH Recent Labs    11/13/20 1452  TSH 1.373   CHOLESTEROL No results for input(s): CHOL in the last 8760 hours.  Scheduled Meds: . carvedilol  3.125 mg Oral BID WC  . Desvenlafaxine Succinate ER  1  tablet Oral Daily  . enoxaparin (LOVENOX) injection  40 mg Subcutaneous Q24H  . folic acid  1 mg Oral Daily  . losartan  25 mg Oral Daily  . multivitamin with minerals  1 tablet Oral Daily  . pantoprazole  40 mg Oral Daily  . sodium chloride flush  3 mL Intravenous Q12H  . thiamine  100 mg Oral Daily   Or  . thiamine  100 mg Intravenous Daily   Continuous Infusions: PRN Meds:.alum & mag hydroxide-simeth, LORazepam **OR** LORazepam, metoprolol tartrate, prochlorperazine **OR** prochlorperazine  Assessment/Plan: Chest pain Cardiomyopathy r/o ischemia Alcohol use disorder Anxiety Hypokalemia, resolved Tachycardia, improved  Awaiting NM myocardial perfusion stress test.   LOS: 1 day   Time spent including chart review,  lab review, examination, discussion with patient/Nurse : 30 min   [name]  MD  11/14/2020, 8:40 AM

## 2020-11-15 ENCOUNTER — Telehealth: Payer: Self-pay | Admitting: Family

## 2020-11-15 ENCOUNTER — Other Ambulatory Visit (HOSPITAL_COMMUNITY): Payer: Self-pay | Admitting: Cardiovascular Disease

## 2020-11-15 DIAGNOSIS — F32A Depression, unspecified: Secondary | ICD-10-CM

## 2020-11-15 DIAGNOSIS — R9431 Abnormal electrocardiogram [ECG] [EKG]: Secondary | ICD-10-CM

## 2020-11-15 DIAGNOSIS — F4323 Adjustment disorder with mixed anxiety and depressed mood: Secondary | ICD-10-CM

## 2020-11-15 DIAGNOSIS — F10230 Alcohol dependence with withdrawal, uncomplicated: Principal | ICD-10-CM

## 2020-11-15 LAB — COMPREHENSIVE METABOLIC PANEL
ALT: 388 U/L — ABNORMAL HIGH (ref 0–44)
AST: 485 U/L — ABNORMAL HIGH (ref 15–41)
Albumin: 3.6 g/dL (ref 3.5–5.0)
Alkaline Phosphatase: 82 U/L (ref 38–126)
Anion gap: 9 (ref 5–15)
BUN: 6 mg/dL (ref 6–20)
CO2: 26 mmol/L (ref 22–32)
Calcium: 9.2 mg/dL (ref 8.9–10.3)
Chloride: 101 mmol/L (ref 98–111)
Creatinine, Ser: 0.66 mg/dL (ref 0.44–1.00)
GFR, Estimated: >60 mL/min (ref >60)
Glucose, Bld: 113 mg/dL — ABNORMAL HIGH (ref 70–99)
Potassium: 3.6 mmol/L (ref 3.5–5.1)
Sodium: 136 mmol/L (ref 135–145)
Total Bilirubin: 1 mg/dL (ref 0.3–1.2)
Total Protein: 6.6 g/dL (ref 6.5–8.1)

## 2020-11-15 LAB — CBC
HCT: 33 % — ABNORMAL LOW (ref 36.0–46.0)
Hemoglobin: 11 g/dL — ABNORMAL LOW (ref 12.0–15.0)
MCH: 32.2 pg (ref 26.0–34.0)
MCHC: 33.3 g/dL (ref 30.0–36.0)
MCV: 96.5 fL (ref 80.0–100.0)
Platelets: 123 10*3/uL — ABNORMAL LOW (ref 150–400)
RBC: 3.42 MIL/uL — ABNORMAL LOW (ref 3.87–5.11)
RDW: 12.6 % (ref 11.5–15.5)
WBC: 3.7 10*3/uL — ABNORMAL LOW (ref 4.0–10.5)
nRBC: 0 % (ref 0.0–0.2)

## 2020-11-15 LAB — PHOSPHORUS: Phosphorus: 4 mg/dL (ref 2.5–4.6)

## 2020-11-15 MED ORDER — FOLIC ACID 1 MG PO TABS: 1.0000 mg | ORAL_TABLET | Freq: Every day | ORAL | 0 refills | Status: DC

## 2020-11-15 MED ORDER — POTASSIUM CHLORIDE ER 10 MEQ PO TBCR: 10.0000 meq | EXTENDED_RELEASE_TABLET | Freq: Every day | ORAL | 3 refills | Status: DC

## 2020-11-15 MED ORDER — POTASSIUM CHLORIDE ER 10 MEQ PO TBCR
10.0000 meq | EXTENDED_RELEASE_TABLET | Freq: Every day | ORAL | Status: DC
  Administered 2020-11-15: 10 meq via ORAL
  Filled 2020-11-15 (×2): qty 1

## 2020-11-15 MED ORDER — THIAMINE HCL 100 MG PO TABS: 100.0000 mg | ORAL_TABLET | Freq: Every day | ORAL | 0 refills | Status: DC

## 2020-11-15 MED ORDER — CARVEDILOL 3.125 MG PO TABS: 3.1250 mg | ORAL_TABLET | Freq: Two times a day (BID) | ORAL | 3 refills | Status: DC

## 2020-11-15 MED ORDER — LOSARTAN POTASSIUM 25 MG PO TABS: 25.0000 mg | ORAL_TABLET | Freq: Every day | ORAL | 3 refills | Status: DC

## 2020-11-15 MED ORDER — VENLAFAXINE HCL ER 37.5 MG PO CP24: 37.5000 mg | ORAL_CAPSULE | Freq: Every day | ORAL | Status: DC

## 2020-11-15 MED FILL — CARVEDILOL 3.125 MG TABLET: 3.125 | 15 days supply | Qty: 30 | Fill #0

## 2020-11-15 MED FILL — POTASSIUM CL ER 10 MEQ TAB: 10 | 30 days supply | Qty: 30 | Fill #0

## 2020-11-15 MED FILL — FOLIC ACID 1 MG TABS: 1 | 5 days supply | Qty: 5 | Fill #0

## 2020-11-15 MED FILL — LOSARTAN POTASSIUM 25 MG TA: 25 | 30 days supply | Qty: 30 | Fill #0

## 2020-11-15 NOTE — Consult Note (Signed)
Ref: Sandford Craze, NP   Subjective:  Awake. VS stable. No chest pain. Monitor is sinus rhythm.  Objective:  Vital Signs in the last 24 hours: Temp:  [97.7 F (36.5 C)-98.2 F (36.8 C)] 97.9 F (36.6 C) (02/16 1200) Pulse Rate:  [65-90] 70 (02/16 1200) Cardiac Rhythm: Normal sinus rhythm (02/16 0702) Resp:  [16-18] 16 (02/16 0536) BP: (106-113)/(68-83) 112/83 (02/16 1200) SpO2:  [99 %] 99 % (02/16 1200)  Physical Exam: BP Readings from Last 1 Encounters:  11/15/20 112/83     Wt Readings from Last 1 Encounters:  11/12/20 69.9 kg    Weight change:  Body mass index is 28.17 kg/m. HEENT: [place]/AT, Eyes-Hazel, Conjunctiva-Pink, Sclera-Non-icteric Neck: No JVD, No bruit, Trachea midline. Lungs:  Clear, Bilateral. Cardiac:  Regular rhythm, normal S1 and S2, no S3. II/VI systolic murmur. Abdomen:  Soft, non-tender. BS present. Extremities:  No edema present. No cyanosis. No clubbing. CNS: AxOx3, Cranial nerves grossly intact, moves all 4 extremities.  Skin: Warm and dry.   Intake/Output from previous day: 02/15 0701 - 02/16 0700 In: 983.2 [P.O.:720; IV Piggyback:263.2] Out: -     Lab Results: BMET    Component Value Date/Time   NA 136 11/15/2020 0240   NA 136 11/14/2020 0529   NA 135 11/13/2020 0557   K 3.6 11/15/2020 0240   K 3.8 11/14/2020 0529   K 3.0 (L) 11/13/2020 0557   CL 101 11/15/2020 0240   CL 101 11/14/2020 0529   CL 97 (L) 11/13/2020 0557   CO2 26 11/15/2020 0240   CO2 28 11/14/2020 0529   CO2 27 11/13/2020 0557   GLUCOSE 113 (H) 11/15/2020 0240   GLUCOSE 114 (H) 11/14/2020 0529   GLUCOSE 104 (H) 11/13/2020 0557   BUN 6 11/15/2020 0240   BUN 6 11/14/2020 0529   BUN <5 (L) 11/13/2020 0557   CREATININE 0.66 11/15/2020 0240   CREATININE 0.80 11/14/2020 0529   CREATININE 0.80 11/13/2020 0557   CREATININE 0.73 07/26/2020 1245   CALCIUM 9.2 11/15/2020 0240   CALCIUM 8.6 (L) 11/14/2020 0529   CALCIUM 8.6 (L) 11/13/2020 0557   GFRNONAA >60  11/15/2020 0240   GFRNONAA >60 11/14/2020 0529   GFRNONAA >60 11/13/2020 0557   GFRAA >60 04/10/2016 2140   CBC    Component Value Date/Time   WBC 3.7 (L) 11/15/2020 0240   RBC 3.42 (L) 11/15/2020 0240   HGB 11.0 (L) 11/15/2020 0240   HCT 33.0 (L) 11/15/2020 0240   PLT 123 (L) 11/15/2020 0240   MCV 96.5 11/15/2020 0240   MCH 32.2 11/15/2020 0240   MCHC 33.3 11/15/2020 0240   RDW 12.6 11/15/2020 0240   LYMPHSABS 0.5 (L) 11/12/2020 0630   MONOABS 0.5 11/12/2020 0630   EOSABS 0.4 11/12/2020 0630   BASOSABS 0.0 11/12/2020 0630   HEPATIC Function Panel Recent Labs    11/13/20 0557 11/14/20 0529 11/15/20 0240  PROT 6.8 6.6 6.6   HEMOGLOBIN A1C No components found for: HGA1C,  MPG CARDIAC ENZYMES No results found for: CKTOTAL, CKMB, CKMBINDEX, TROPONINI BNP No results for input(s): PROBNP in the last 8760 hours. TSH Recent Labs    11/13/20 1452  TSH 1.373   CHOLESTEROL No results for input(s): CHOL in the last 8760 hours.  Scheduled Meds: . carvedilol  3.125 mg Oral BID WC  . enoxaparin (LOVENOX) injection  40 mg Subcutaneous Q24H  . folic acid  1 mg Oral Daily  . losartan  25 mg Oral Daily  . multivitamin with minerals  1 tablet Oral Daily  . pantoprazole  40 mg Oral Daily  . potassium chloride  10 mEq Oral Daily  . sodium chloride flush  3 mL Intravenous Q12H  . thiamine  100 mg Oral Daily   Or  . thiamine  100 mg Intravenous Daily  . venlafaxine XR  37.5 mg Oral Q breakfast   Continuous Infusions: PRN Meds:.alum & mag hydroxide-simeth, prochlorperazine **OR** prochlorperazine  Assessment/Plan: Chest pain Alcoholic cardiomyopathy Alcohol use disorder Anxiety Depression Hypokalemia, resolved Tachycardia, improved  Patient agrees not to resume alcohol and see behavioral health clinic as arranged F/U 1 week.   LOS: 2 days   Time spent including chart review, lab review, examination, discussion with patient/Nurse/Physician : 30 min   [name]   MD  11/15/2020, 2:10 PM

## 2020-11-15 NOTE — Progress Notes (Signed)
Pharmacy: Phosphorus replacement  Patient is a 45 y.o F with hx EtOH presented to the ED on 2/13 with c/o intractable n/v. Pharmacy has been consulted to replace phosphorus.  - 2/13: phos 1.4 --> Kphos 30 mmol IV x1 - 2/14: K 3, Na 135, phos 2.1--> Kphos 15 mmol IV x1 - 2/15: K 3.8, Na 136, phos up 2.4 - 2/16: Phos WNL, patient discharging   Plan: - No phos repletion today  Bernadene Person, PharmD, BCPS [phone] 11/15/2020, 1:41 PM

## 2020-11-15 NOTE — Telephone Encounter (Signed)
Please contact pt to schedule hospital follow up with me next week.

## 2020-11-15 NOTE — Discharge Summary (Addendum)
Physician Discharge Summary [facility]    Patient: Pamela Morris                   Admit date: 11/12/2020   DOB: [birth year]             Discharge date:11/15/2020/2:33 PM GMW:[id number]                          PCP: Sandford Craze, NP  Disposition: HOME Recommendations for Outpatient Follow-up:   Follow up: in 2 weeks with cardiology Follow-up PCP with your psychiatrist  Discharge Condition: Stable   Code Status:   Code Status: Full Code  Diet recommendation: Cardiac diet   Discharge Diagnoses:    Principal Problem:   Alcohol withdrawal (HCC) Active Problems:   Adjustment disorder with mixed anxiety and depressed mood   Intractable nausea and vomiting   Depression   Prolonged QT interval   Hypokalemia   High anion gap metabolic acidosis   History of Present Illness/ Hospital Course Pamela Morris Summary:   Pamela Morris is a 45 year old female with medical history of anxiety, depression, former tobacco use, ongoing vaping, alcohol use disorder, presented to the ED with complaints of intractable nausea and vomiting, inability to tolerate p.o. palpitations and chest pain that started the night prior to admission.  She reported recent acute viral GE, increased alcohol consumption, stopped taking her antidepressants/anxiolytics 2 to 3 weeks PTA.  Admitted for alcohol withdrawal, intractable nausea and vomiting, and chest pain.  Clinically improving, advancing diet.  Cardiology/Dr. Algie Coffer consulted.  Psychiatry consulted.  S/p low risk stress test 2/15.  Possible DC home 2/16.   Alcohol use disorder presented with alcohol withdrawal  Recent increase in alcohol consumption up to bottle of vodka per day.  Blood alcohol level on admission <10.  Prior history of alcohol withdrawal hallucinations about a year ago but no history of seizures.  Has been to alcohol rehab program but does not wish to return.  Last alcohol drink was at approximately 8 PM on  2/12.  Treated per CIWA protocol.  Improving.  Low CIWA scores 1 > 3 .Marland Kitchen Improved  Encouraged alcohol abstinence, follow-up with alcohol Anonymous/CSW consulted for assistance with resources, psychiatry consulted.  Patient remained stable--- emphasized and counseled regarding alcohol cessation  Intractable nausea and vomiting:  Possibly multifactorial related to alcohol intoxication, GERD, gastritis versus others.  Ongoing heartburn symptoms, changed Pepcid to PPI, added as needed Maalox/Mylanta  Tolerating liquid diet, advance to regular diet as tolerated.  As needed antiemetics.  Much improved tolerating p.o.  GERD:  Improved  High anion gap metabolic acidosis  On admission bicarbonate 16, anion gap 23.  Resolved, stable  Alcoholic hepatitis suspected  Acute hepatitis panel negative, HIV screen negative  RUQ ultrasound: Stable focus of decreased echogenicity in the anterior segment of the right lobe of the liver, stable compared to prior studies-per radiology suggests benign etiology and may represent small hemangioma superimposed on hepatic steatosis.  No new liver lesions evident.  Consider outpatient follow-up for the lesion noted on right lobe of the liver.  Alcohol abstinence and follow LFTs.  Slightly worse transaminitis trending down  Hypokalemia/hypomagnesemia/hypophosphatemia  Admission labs: Potassium 2.8, phosphorus 1.4, magnesium 1.5.  Potassium 3.8, magnesium 2.3.  Phosphorus 2.4, replace and follow.  Pancytopenia  Likely due to alcohol toxic effects.  Follow CBC in a.m.  Chest pain  Reported almost 9 hours of precordial chest pain radiating to left upper extremity on  2/12 which eventually resolved in the ED.  Admission EKG with sinus tachycardia at 155 bpm with diffuse ST depressions.  HS 1 and x2: Negative (13 > 11).    Not ACS.  No recurrence of chest pain.  Underwent nuclear stress test 2/15: No reversible ischemia or infarction,  mild generalized hypokinesia, LVEF 47%.  Low risk.  NM-myocardial perfusion stress test -reviewed by cardiology negative for any acute ischemic changes.-finding consistent with alcoholic cardiomyopathy  Denies any chest pain  Nonischemic cardiomyopathy,?  Alcoholic.  2D echo 2/14: LVEF 45-50%, LV has mildly decreased function, LV demonstrated regional wall motion abnormality and grade 1 diastolic dysfunction.  Cardiology follow-up appreciated.  Low risk stress test, suspecting alcoholic cardiomyopathy.  Cardiology have initiated her on beta-blockers and ARB, continue.  Outpatient follow-up with Dr. Algie Coffer.  Nuclear stress test revealed no ischemic changes, alcoholic cardiomyopathy  Cardiology recommended to continue Coreg and losartan  Palpitations  Sinus tachycardia on admission in the 150s,   likely multifactorial related to alcohol withdrawal, pain, anxiety versus others.  Resolved.  Telemetry shows sinus rhythm without arrhythmias.  TSH 1.373.  Remains in sinus rhythm on telemetry  Adjustment disorder with mixed anxiety and depressed mood:  No SI, HI, AVH  Psychiatric consultation appreciated: Recommend outpatient behavioral health follow-up/CDIOP, resuming prior home medication (Pristiq), continue CIWA protocol, does not meet criteria for psychiatric inpatient admission  May resume prior home dose of Xanax at discharge.  Prolonged QTC  EKG on admission 2/13: QTC 506 ms in the context of marked hypokalemia, hypomagnesemia.  EKG 2/14: QTC 450 ms  Minimize QT prolonging medications.  Follow EKG in a.m. 2/16  Body mass index is 28.17 kg/m.     Disposition:   Dispo: The patient is from: Home  Anticipated d/c is to: Home     Consultants:   Psychiatry Cardiology  Procedures:   NM-myocardial perfusion stress test -reviewed by cardiology negative for any acute ischemic changes.-finding consistent with alcoholic  cardiomyopathy   Discharge Instructions:   Discharge Instructions    Activity as tolerated - No restrictions   Complete by: As directed    Diet - low sodium heart healthy   Complete by: As directed    Discharge instructions   Complete by: As directed    Absolutely to stay away from any alcohol or alcohol products... Continue psych meds and follow-up with your psychiatrist as soon as possible   Increase activity slowly   Complete by: As directed        Medication List    TAKE these medications   ALPRAZolam 0.5 MG tablet Commonly known as: XANAX TAKE 1 TABLET BY MOUTH 2 TIMES DAILY AS NEEDED FOR ANXIETY What changed: See the new instructions.   carvedilol 3.125 MG tablet Commonly known as: COREG Take 1 tablet (3.125 mg total) by mouth 2 (two) times daily with a meal.   Desvenlafaxine Succinate ER 25 MG Tb24 Commonly known as: Pristiq Take 25 mg by mouth daily.   folic acid 1 MG tablet Commonly known as: FOLVITE Take 1 tablet (1 mg total) by mouth daily for 5 days.   ibuprofen 200 MG tablet Commonly known as: ADVIL Take 400 mg by mouth every 6 (six) hours as needed for fever, headache or mild pain.   levonorgestrel 20 MCG/24HR IUD Commonly known as: MIRENA 1 each by Intrauterine route once.   losartan 25 MG tablet Commonly known as: COZAAR Take 1 tablet (25 mg total) by mouth daily.   multivitamin with minerals Tabs  tablet Take 1 tablet by mouth daily.   potassium chloride 10 MEQ tablet Commonly known as: KLOR-CON Take 1 tablet (10 mEq total) by mouth daily.   thiamine 100 MG tablet Take 1 tablet (100 mg total) by mouth daily for 5 days.       Follow-up Information    [facility] Follow up.   Specialty: [facility] information: [address] [place] [address] [id number] mc [place] [address] [phone]       [name], MD. Schedule an appointment as soon as possible for a visit in 1 week(s).    Specialty: Cardiology Contact information: [address] [place] [address] [phone]              Allergies  Allergen Reactions  . Lexapro [Escitalopram Oxalate] Other (See Comments)    LACK OF EMOTION     Procedures /Studies:   NM Myocar Multi W/Spect W/Wall Motion / EF  Result Date: 11/14/2020 CLINICAL DATA:  Chest pain EXAM: MYOCARDIAL IMAGING WITH SPECT (REST AND PHARMACOLOGIC-STRESS) GATED LEFT VENTRICULAR WALL MOTION STUDY LEFT VENTRICULAR EJECTION FRACTION TECHNIQUE: Standard myocardial SPECT imaging was performed after resting intravenous injection of 11 mCi Tc-61m tetrofosmin. Subsequently, intravenous infusion of Lexiscan was performed under the supervision of the Cardiology staff. At peak effect of the drug, 32 mCi Tc-82m tetrofosmin was injected intravenously and standard myocardial SPECT imaging was performed. Quantitative gated imaging was also performed to evaluate left ventricular wall motion, and estimate left ventricular ejection fraction. COMPARISON:  None. FINDINGS: Perfusion: No decreased activity in the left ventricle on stress imaging to suggest reversible ischemia or infarction. Wall Motion: Mild generalized hypokinesia. Left Ventricular Ejection Fraction: 47 % End diastolic volume 65 ml End systolic volume 34 ml IMPRESSION: 1. No reversible ischemia or infarction. 2. Mild generalized hypokinesia. 3. Left ventricular ejection fraction 47% 4. Non invasive risk stratification*: Low *2012 Appropriate Use Criteria for Coronary Revascularization Focused Update: J Am Coll Cardiol. 2012;59(9):857-881. http://content.dementiazones.com.aspx?articleid=[email or web address] Electronically Signed   By: Charlett Nose M.D.   On: 11/14/2020 12:29   DG Chest Port 1 View  Result Date: 11/12/2020 CLINICAL DATA:  45 year old female with chest pain, dehydration. EXAM: PORTABLE CHEST 1 VIEW COMPARISON:  CT Abdomen and Pelvis 04/11/2016. FINDINGS: Portable AP semi upright view at  0552 hours. Normal lung volumes and mediastinal contours. Visualized tracheal air column is within normal limits. Allowing for portable technique the lungs are clear. No pneumothorax. Negative visible bowel gas pattern. No evidence of pneumoperitoneum. No osseous abnormality identified. IMPRESSION: Negative portable chest. Electronically Signed   By: Odessa Fleming M.D.   On: 11/12/2020 06:05   ECHOCARDIOGRAM COMPLETE  Result Date: 11/13/2020    ECHOCARDIOGRAM REPORT   Patient Name:   PHILLIS THACKERAY Date of Exam: 11/13/2020 Medical Rec #:  [id number]          Height:       62.0 in Accession #:    [id number]         Weight:       154.0 lb Date of Birth:  [birth year]          BSA:          1.711 m Patient Age:    44 years           BP:           125/98 mmHg Patient Gender: F  HR:           97 bpm. Exam Location:  Inpatient Procedure: 2D Echo, Cardiac Doppler and Color Doppler Indications:     R07.9* Chest pain, unspecified  History:         Patient has no prior history of Echocardiogram examinations.  Sonographer:     Elmarie Shiley Dance Referring Phys:  [id number] [name] Diagnosing Phys: [name] MD IMPRESSIONS  1. Left ventricular ejection fraction, by estimation, is 45 to 50%. The left ventricle has mildly decreased function. The left ventricle demonstrates regional wall motion abnormalities (see scoring diagram/findings for description). Left ventricular diastolic parameters are consistent with Grade I diastolic dysfunction (impaired relaxation). There is moderate hypokinesis of the left ventricular, entire anterior wall. There is mild hypokinesis of the left ventricular, entire septal wall.  2. Right ventricular systolic function is mildly reduced. The right ventricular size is normal.  3. Left atrial size was mildly dilated.  4. The mitral valve is normal in structure. Trivial mitral valve regurgitation. No evidence of mitral stenosis.  5. The aortic valve is tricuspid. There is mild calcification of  the aortic valve. There is mild thickening of the aortic valve. Aortic valve regurgitation is not visualized.  6. The inferior vena cava is normal in size with greater than 50% respiratory variability, suggesting right atrial pressure of 3 mmHg. FINDINGS  Left Ventricle: Left ventricular ejection fraction, by estimation, is 45 to 50%. The left ventricle has mildly decreased function. The left ventricle demonstrates regional wall motion abnormalities. Moderate hypokinesis of the left ventricular, entire anterior wall. Mild hypokinesis of the left ventricular, entire septal wall. The left ventricular internal cavity size was normal in size. There is no left ventricular hypertrophy. Left ventricular diastolic parameters are consistent with Grade I diastolic dysfunction (impaired relaxation).  LV Wall Scoring: The entire anterior wall and entire septum are hypokinetic. The entire lateral wall, entire inferior wall, and apex are normal. Right Ventricle: The right ventricular size is normal. No increase in right ventricular wall thickness. Right ventricular systolic function is mildly reduced. Left Atrium: Left atrial size was mildly dilated. Right Atrium: Right atrial size was normal in size. Pericardium: There is no evidence of pericardial effusion. Mitral Valve: The mitral valve is normal in structure. Trivial mitral valve regurgitation. No evidence of mitral valve stenosis. Tricuspid Valve: The tricuspid valve is normal in structure. Tricuspid valve regurgitation is mild. Aortic Valve: The aortic valve is tricuspid. There is mild calcification of the aortic valve. There is mild thickening of the aortic valve. Aortic valve regurgitation is not visualized. Pulmonic Valve: The pulmonic valve was normal in structure. Pulmonic valve regurgitation is not visualized. Aorta: The aortic root is normal in size and structure. Venous: The inferior vena cava is normal in size with greater than 50% respiratory variability,  suggesting right atrial pressure of 3 mmHg. IAS/Shunts: The interatrial septum was not well visualized.  LEFT VENTRICLE PLAX 2D LVIDd:         4.30 cm  Diastology LVIDs:         3.30 cm  LV e' medial:    8.38 cm/s LV PW:         1.00 cm  LV E/e' medial:  7.1 LV IVS:        0.70 cm  LV e' lateral:   9.79 cm/s LVOT diam:     1.80 cm  LV E/e' lateral: 6.1 LV SV:         43 LV SV Index:  25 LVOT Area:     2.54 cm  RIGHT VENTRICLE             IVC RV Basal diam:  2.40 cm     IVC diam: 1.30 cm RV S prime:     12.40 cm/s TAPSE (M-mode): 1.6 cm LEFT ATRIUM             Index       RIGHT ATRIUM          Index LA diam:        2.90 cm 1.70 cm/m  RA Area:     9.82 cm LA Vol (A2C):   36.3 ml 21.22 ml/m RA Volume:   16.70 ml 9.76 ml/m LA Vol (A4C):   26.6 ml 15.55 ml/m LA Biplane Vol: 32.0 ml 18.71 ml/m  AORTIC VALVE LVOT Vmax:   98.30 cm/s LVOT Vmean:  70.000 cm/s LVOT VTI:    0.169 m  AORTA Ao Root diam: 3.00 cm Ao Asc diam:  2.70 cm MITRAL VALVE MV Area (PHT): 3.37 cm    SHUNTS MV Decel Time: 225 msec    Systemic VTI:  0.17 m MV E velocity: 59.90 cm/s  Systemic Diam: 1.80 cm MV A velocity: 66.00 cm/s MV E/A ratio:  0.91 [name] MD Electronically signed by [name] MD Signature Date/Time: 11/13/2020/6:04:04 PM    Final    US Abdomen Limited RUQ (LIVER/GB)  Result Date: 11/12/2020 CLINICAL DATA:  Elevated liver enzymes EXAM: ULTRASOUND ABDOMEN LIMITED RIGHT UPPER QUADRANT COMPARISON:  Abdominal ultrasound August 01, 2018; CT abdomen and pelvis April 11, 2016 FINDINGS: Gallbladder: No gallstones or wall thickening visualized. There is no pericholecystic fluid. No sonographic Murphy sign noted by sonographer. Common bile duct: Diameter: 3 mm. No intrahepatic or extrahepatic biliary duct dilatation. Liver: Hypoechoic focus in the anterior segment right lobe of the liver measures 1.7 x 1.8 x 1.0 cm, present on prior CT and ultrasound examination without evident change. No new liver lesions are appreciable. Liver  echogenicity overall is increased, a stable finding from prior study. Portal vein is patent on color Doppler imaging with normal direction of blood flow towards the liver. Other: None. IMPRESSION: 1. Stable focus of decreased echogenicity in the anterior segment right lobe of the liver, stable compared to prior studies. Stability over time is indicative of benign etiology. This lesion may well represent a small hemangioma superimposed on hepatic steatosis. 2. No new liver lesions evident. Note that there is overall increased liver echogenicity, a pattern indicative of hepatic steatosis. Note that the sensitivity of ultrasound for detection of focal liver lesions is somewhat diminished in this circumstance. 3.  Study otherwise unremarkable. Electronically Signed   By: Bretta Bang III M.D.   On: 11/12/2020 10:32    Subjective:   Patient was seen and examined 11/15/2020, 2:33 PM Patient stable today. No acute distress.  No issues overnight Stable for discharge.  Discharge Exam:    Vitals:   11/14/20 1856 11/15/20 0013 11/15/20 0536 11/15/20 1200  BP: 113/72 106/68 112/80 112/83  Pulse: 90 74 65 70  Resp: 18 16 16    Temp: 98.2 F (36.8 C) 97.7 F (36.5 C) 98 F (36.7 C) 97.9 F (36.6 C)  TempSrc:  Oral Oral Oral  SpO2: 99% 99% 99% 99%  Weight:      Height:        General: Pt lying comfortably in bed & appears in no obvious distress. Cardiovascular: S1 & S2 heard, RRR, S1/S2 +. No  murmurs, rubs, gallops or clicks. No JVD or pedal edema. Respiratory: Clear to auscultation without wheezing, rhonchi or crackles. No increased work of breathing. Abdominal:  Non-distended, non-tender & soft. No organomegaly or masses appreciated. Normal bowel sounds heard. CNS: Alert and oriented. No focal deficits. Extremities: no edema, no cyanosis      The results of significant diagnostics from this hospitalization (including imaging, microbiology, ancillary and laboratory) are listed below for  reference.      Microbiology:   Recent Results (from the past 240 hour(s))  Resp Panel by RT-PCR (Flu A&B, Covid) Nasopharyngeal Swab     Status: None   Collection Time: 11/12/20  1:26 PM   Specimen: Nasopharyngeal Swab; Nasopharyngeal(NP) swabs in vial transport medium  Result Value Ref Range Status   SARS Coronavirus 2 by RT PCR NEGATIVE NEGATIVE Final    Comment: (NOTE) SARS-CoV-2 target nucleic acids are NOT DETECTED.  The SARS-CoV-2 RNA is generally detectable in upper respiratory specimens during the acute phase of infection. The lowest concentration of SARS-CoV-2 viral copies this assay can detect is 138 copies/mL. A negative result does not preclude SARS-Cov-2 infection and should not be used as the sole basis for treatment or other patient management decisions. A negative result may occur with  improper specimen collection/handling, submission of specimen other than nasopharyngeal swab, presence of viral mutation(s) within the areas targeted by this assay, and inadequate number of viral copies(<138 copies/mL). A negative result must be combined with clinical observations, patient history, and epidemiological information. The expected result is Negative.  Fact Sheet for Patients:  BloggerCourse.comhttps://www.fda.gov/media/152166/download  Fact Sheet for Healthcare Providers:  SeriousBroker.ithttps://www.fda.gov/media/152162/download  This test is no t yet approved or cleared by the [place] FDA and  has been authorized for detection and/or diagnosis of SARS-CoV-2 by FDA under an Emergency Use Authorization (EUA). This EUA will remain  in effect (meaning this test can be used) for the duration of the COVID-19 declaration under Section 564(b)(1) of the Act, 21 U.S.C.section 360bbb-3(b)(1), unless the authorization is terminated  or revoked sooner.       Influenza A by PCR NEGATIVE NEGATIVE Final   Influenza B by PCR NEGATIVE NEGATIVE Final    Comment: (NOTE) The Xpert Xpress  SARS-CoV-2/FLU/RSV plus assay is intended as an aid in the diagnosis of influenza from Nasopharyngeal swab specimens and should not be used as a sole basis for treatment. Nasal washings and aspirates are unacceptable for Xpert Xpress SARS-CoV-2/FLU/RSV testing.  Fact Sheet for Patients: BloggerCourse.comhttps://www.fda.gov/media/152166/download  Fact Sheet for Healthcare Providers: SeriousBroker.ithttps://www.fda.gov/media/152162/download  This test is not yet approved or cleared by the [place] FDA and has been authorized for detection and/or diagnosis of SARS-CoV-2 by FDA under an Emergency Use Authorization (EUA). This EUA will remain in effect (meaning this test can be used) for the duration of the COVID-19 declaration under Section 564(b)(1) of the Act, 21 U.S.C. section 360bbb-3(b)(1), unless the authorization is terminated or revoked.  Performed at [facility], [address] [place], [place] [id number]      Labs:   CBC: Recent Labs  Lab 11/12/20 0630 11/12/20 1120 11/13/20 0557 11/14/20 0529 11/15/20 0240  WBC 10.4 11.3* 4.5 3.5* 3.7*  NEUTROABS 8.8*  --   --   --   --   HGB 14.5 12.9 11.9* 11.6* 11.0*  HCT 41.2 36.2 34.7* 34.9* 33.0*  MCV 90.5 89.6 94.0 96.4 96.5  PLT 184 144* 116* 103* 123*   Basic Metabolic Panel: Recent Labs  Lab 11/12/20 0600  11/12/20 0630 11/13/20 0557 11/14/20 0529 11/15/20 0240  NA  --  133* 135 136 136  K  --  2.8* 3.0* 3.8 3.6  CL  --  94* 97* 101 101  CO2  --  16* 27 28 26   GLUCOSE  --  157* 104* 114* 113*  BUN  --  <5* <5* 6 6  CREATININE  --  1.09* 0.80 0.80 0.66  CALCIUM  --  9.0 8.6* 8.6* 9.2  MG 1.5*  --  2.3  --   --   PHOS 1.4*  --  2.1* 2.4* 4.0   Liver Function Tests: Recent Labs  Lab 11/12/20 0630 11/13/20 0557 11/14/20 0529 11/15/20 0240  AST 198* 259* 506* 485*  ALT 179* 190* 317* 388*  ALKPHOS 77 66 72 82  BILITOT 1.5* 2.2* 1.7* 1.0  PROT 8.2* 6.8 6.6 6.6  ALBUMIN 4.7 3.8 3.8 3.6   BNP (last 3  results) No results for input(s): BNP in the last 8760 hours. Cardiac Enzymes: No results for input(s): CKTOTAL, CKMB, CKMBINDEX, TROPONINI in the last 168 hours. CBG: No results for input(s): GLUCAP in the last 168 hours. Hgb A1c No results for input(s): HGBA1C in the last 72 hours. Lipid Profile No results for input(s): CHOL, HDL, LDLCALC, TRIG, CHOLHDL, LDLDIRECT in the last 72 hours. Thyroid function studies Recent Labs    11/13/20 1452  TSH 1.373   Anemia work up No results for input(s): VITAMINB12, FOLATE, FERRITIN, TIBC, IRON, RETICCTPCT in the last 72 hours. Urinalysis    Component Value Date/Time   COLORURINE YELLOW 04/10/2016 2128   APPEARANCEUR CLEAR 04/10/2016 2128   LABSPEC 1.025 04/10/2016 2128   PHURINE 5.5 04/10/2016 2128   GLUCOSEU NEGATIVE 04/10/2016 2128   HGBUR LARGE (A) 04/10/2016 2128   BILIRUBINUR NEGATIVE 04/10/2016 2128   KETONESUR >80 (A) 04/10/2016 2128   PROTEINUR NEGATIVE 04/10/2016 2128   NITRITE NEGATIVE 04/10/2016 2128   LEUKOCYTESUR NEGATIVE 04/10/2016 2128         Time coordinating discharge: Over 45 minutes  SIGNED: 2129, MD, FACP, FHM. [facility],  Please use [email or web address] to Page If 7PM-7AM, please contact night-coverage [email or web address] [facility] 11/15/2020, 2:33 PM

## 2020-11-15 NOTE — Plan of Care (Signed)

## 2020-11-15 NOTE — TOC Transition Note (Signed)
Transition of Care [facility]) - CM/SW Discharge Note   Patient Details  Name: Pamela Morris MRN: [id number] Date of Birth: 1976-07-03  [facility] CM/SW Contact:  Darleene Cleaver, LCSW Phone Number: 11/15/2020, 12:36 PM   Clinical Narrative:     Patient will be going home.  CSW signing off please reconsult with any other social work needs. Substance abuse resources for AA were provided by previous RN case manager on Monday.      Final next level of care: Home/Self Care Barriers to Discharge: Continued Medical Work up   Patient Goals and CMS Choice Patient states their goals for this hospitalization and ongoing recovery are:: to go home      Discharge Placement                       Discharge Plan and Services   Discharge Planning Services: CM Consult                                 Social Determinants of Health (SDOH) Interventions     Readmission Risk Interventions No flowsheet data found.

## 2020-11-17 ENCOUNTER — Other Ambulatory Visit: Payer: Self-pay | Admitting: *Deleted

## 2020-11-17 NOTE — Telephone Encounter (Signed)
spoked to patient. appt schedule on 11/27/20

## 2020-11-17 NOTE — Patient Outreach (Signed)
[facility]) Care Management  11/17/2020  Pamela Morris 10/25/1975 [id number]   Transition of care telephone call  Referral received:11/15/20 Initial outreach:11/17/20 Insurance: [facility]   Initial unsuccessful telephone call to patient's preferred number in order to complete transition of care assessment; no answer, left HIPAA compliant voicemail message requesting return call.   Objective: Per the electronic medical record, Pamela Morris   was hospitalized at [facility]  2/14-2/16/22 for Alcohol withdrawal  . Comorbidities include: Depression , Alcohol use disorder. She  was discharged to home on 11/15/20 without the need for home health services or durable medical equipment per the discharge summary.   Plan: This RNCM will route unsuccessful outreach letter with [facility] Care Management pamphlet and 24 hour Nurse Advice Line Magnet to [employer] Care Management clinical pool to be mailed to patient's home address. This RNCM will attempt another outreach within 4 business days.   Egbert Garibaldi, RN, BSN  [facility] Care Management,Care Management Coordinator  [phone]- Mobile [phone]- Toll Free Main Office

## 2020-11-22 ENCOUNTER — Other Ambulatory Visit: Payer: Self-pay | Admitting: *Deleted

## 2020-11-22 ENCOUNTER — Encounter: Payer: Self-pay | Admitting: *Deleted

## 2020-11-22 NOTE — Patient Outreach (Signed)
[facility]) Care Management  11/22/2020  Pamela Morris September 19, 1976 [id number]   Transition of care call/case closure   Referral received: 11/15/20 Initial outreach:11/17/20 Insurance: [employer] [place] [employer]    Subjective: 2nd attempt  successful telephone call to patient's preferred number in order to complete transition of care assessment; 2 HIPAA identifiers verified. Explained purpose of call and completed transition of care assessment.  Pamela Morris states that she is doing pretty good . She has returned to work in the last week, she works from home. She denies chest pain, nausea, she reports completely abstained from alcohol since hospital admission.She reports tolerating diet , reports  taking all medications as prescribed, denies difficulties.     Reviewed accessing the following [employer] Benefits :  She does  have the hospital indemnity, provided [employer] contact number to file claim as needed.  She uses a [facility] outpatient pharmacy at [place]. Provided contact information for Employee assistance counseling program [employer], [phone] for benefit option for support.      Objective:   Pamela Morris   was hospitalized at [facility]  2/14-2/16/22 for Alcohol withdrawal, nausea and vomiting, chest pain.   . Comorbidities include: Depression , Alcohol use disorder. She  was discharged to home on 11/15/20 without the need for home health services or durable medical equipment per the discharge summary.   Assessment:  Patient voices good understanding of all discharge instructions.  See transition of care flowsheet for assessment details.   Plan:  Reviewed hospital discharge diagnosis of Chest pain , alcohol withdrawl  and discharge treatment plan using hospital discharge instructions, assessing medication adherence, reviewing problems requiring provider notification, and discussing the importance of follow up with surgeon, primary care  provider and/or specialists as directed. Reinforced follow up on scheduling Behavorial health appointment as recommended at discharge for ongoing support and follow up with resources regarding  substance use .  Reviewed [place] healthy lifestyle program information to receive discounted premium for  2023   Step 1: Get  your annual physical  Step 2: Complete your health assessment  Step 3:Identify your current health status and complete the corresponding action step between September 30, 2020 and May 31, 2021.       No ongoing care management needs identified so will close case to [facility] Care Management services and route successful outreach letter with [facility] Care Management pamphlet and 24 Hour Nurse Line Magnet to [employer] Care Management clinical pool to be mailed to patient's home address.  Thanked patient for their services to [facility].  Egbert Garibaldi, RN, BSN  [facility] Care Management,Care Management Coordinator  [phone]- Mobile [phone]- Toll Free Main Office

## 2020-11-27 ENCOUNTER — Ambulatory Visit: Payer: 59 | Admitting: Family

## 2020-11-27 ENCOUNTER — Encounter: Payer: Self-pay | Admitting: Family

## 2020-11-27 ENCOUNTER — Other Ambulatory Visit: Payer: Self-pay

## 2020-11-27 VITALS — BP 117/78 | HR 76 | Temp 98.2°F | Resp 16 | Ht 62.0 in | Wt 153.6 lb

## 2020-11-27 DIAGNOSIS — R9431 Abnormal electrocardiogram [ECG] [EKG]: Secondary | ICD-10-CM | POA: Diagnosis not present

## 2020-11-27 DIAGNOSIS — I429 Cardiomyopathy, unspecified: Secondary | ICD-10-CM | POA: Diagnosis not present

## 2020-11-27 DIAGNOSIS — F32A Depression, unspecified: Secondary | ICD-10-CM | POA: Diagnosis not present

## 2020-11-27 DIAGNOSIS — F1010 Alcohol abuse, uncomplicated: Secondary | ICD-10-CM

## 2020-11-27 DIAGNOSIS — D61818 Other pancytopenia: Secondary | ICD-10-CM

## 2020-11-27 DIAGNOSIS — E876 Hypokalemia: Secondary | ICD-10-CM | POA: Diagnosis not present

## 2020-11-27 LAB — COMPREHENSIVE METABOLIC PANEL
ALT: 97 U/L — ABNORMAL HIGH (ref 0–35)
AST: 36 U/L (ref 0–37)
Albumin: 4.3 g/dL (ref 3.5–5.2)
Alkaline Phosphatase: 71 U/L (ref 39–117)
BUN: 7 mg/dL (ref 6–23)
CO2: 27 mEq/L (ref 19–32)
Calcium: 9.3 mg/dL (ref 8.4–10.5)
Chloride: 101 mEq/L (ref 96–112)
Creatinine, Ser: 0.59 mg/dL (ref 0.40–1.20)
GFR: 108.78 mL/min (ref >60.00)
Glucose, Bld: 90 mg/dL (ref 70–99)
Potassium: 4.2 mEq/L (ref 3.5–5.1)
Sodium: 137 mEq/L (ref 135–145)
Total Bilirubin: 0.5 mg/dL (ref 0.2–1.2)
Total Protein: 6.9 g/dL (ref 6.0–8.3)

## 2020-11-27 LAB — CBC WITH DIFFERENTIAL/PLATELET
Basophils Absolute: 0.1 10*3/uL (ref 0.0–0.1)
Basophils Relative: 1.3 % (ref 0.0–3.0)
Eosinophils Absolute: 0.1 10*3/uL (ref 0.0–0.7)
Eosinophils Relative: 1.6 % (ref 0.0–5.0)
HCT: 39.7 % (ref 36.0–46.0)
Hemoglobin: 13.3 g/dL (ref 12.0–15.0)
Lymphocytes Relative: 22.9 % (ref 12.0–46.0)
Lymphs Abs: 1.4 10*3/uL (ref 0.7–4.0)
MCHC: 33.5 g/dL (ref 30.0–36.0)
MCV: 98.4 fl (ref 78.0–100.0)
Monocytes Absolute: 0.5 10*3/uL (ref 0.1–1.0)
Monocytes Relative: 8.1 % (ref 3.0–12.0)
Neutro Abs: 4.1 10*3/uL (ref 1.4–7.7)
Neutrophils Relative %: 66.1 % (ref 43.0–77.0)
Platelets: 380 10*3/uL (ref 150.0–400.0)
RBC: 4.04 Mil/uL (ref 3.87–5.11)
RDW: 13.8 % (ref 11.5–15.5)
WBC: 6.2 10*3/uL (ref 4.0–10.5)

## 2020-11-27 NOTE — Progress Notes (Signed)
Subjective:    Patient ID: Pamela Morris, female    DOB: 1976-09-10, 45 y.o.   MRN: [id number]  HPI  Patient is a 45 yr old female who presents today for hospital follow up. She presented to the ED with intractable N/V.  This was in conjunction with increased alcohol consumption and discontinuation of her anti-depressants x 2-3 weeks prior to admission. She had been consuming up to 1 bottle of vodka a day. She was noted to have alcoholic hepatitis. She had a liver lesion noted on the right lobe of her liver. 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  She had a low risk stress test in patient. She was started on beta blockers and ARB.  She was restarted on pristiq prior to discharge.   She reports that since returning home her mood has been better. She is taking all of her medications.  She has not had a drink since prior to admission. She does have some motivation issues surrounding housework, grocery shopping. She did clean her baththroom and do laundry.    Review of Systems See HPI  Past Medical History:  Diagnosis Date  . Blood type, Rh negative   . Prolonged QT interval 11/12/2020  . Yeast infection      Social History   Socioeconomic History  . Marital status: Divorced    Spouse name: Not on file  . Number of children: Not on file  . Years of education: Not on file  . Highest education level: Not on file  Occupational History  . Not on file  Tobacco Use  . Smoking status: Former Smoker    Packs/day: 0.25    Years: 2.00    Pack years: 0.50    Types: Cigarettes  . Smokeless tobacco: Never Used  Vaping Use  . Vaping Use: Every day  Substance and Sexual Activity  . Alcohol use: Yes    Comment: occasiona;  Marland Kitchen Drug use: No  . Sexual activity: Yes    Birth control/protection: I.U.D.    Comment: Mirena X 03/2011  Other Topics Concern  . 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 BREAST ENHANCEMENT SURGERY  07/2014  . CESAREAN SECTION    . LAPAROSCOPIC APPENDECTOMY N/A 04/11/2016   Procedure: APPENDECTOMY LAPAROSCOPIC;  Surgeon: Violeta Gelinas, MD;  Location: [facility];  Service: General;  Laterality: N/A;  . lipoma Left 2007    Family History  Problem Relation Age of Onset  . Peptic Ulcer Father   . Hypertension Mother   . Aneurysm Mother     Allergies  Allergen Reactions  . Lexapro [Escitalopram Oxalate] Other (See Comments)    LACK OF EMOTION    Current Outpatient Medications on File Prior to Visit  Medication Sig Dispense Refill  . ALPRAZolam (XANAX) 0.5 MG tablet TAKE 1  TABLET BY MOUTH 2 TIMES DAILY AS NEEDED FOR ANXIETY (Patient taking differently: Take 0.5 mg by mouth 2 (two) times daily as needed for anxiety.) 30 tablet 0  . carvedilol (COREG) 3.125 MG tablet Take 1 tablet (3.125 mg total) by mouth 2 (two) times daily with a meal. 30 tablet 3  . Desvenlafaxine Succinate ER (PRISTIQ) 25 MG TB24 Take 25 mg by mouth daily. 90 tablet 1  . ibuprofen (ADVIL) 200 MG tablet Take 400 mg by mouth every 6 (six) hours as needed for fever, headache or mild pain.    Marland Kitchen levonorgestrel (MIRENA) 20 MCG/24HR IUD 1 each by Intrauterine route once.    Marland Kitchen losartan (COZAAR) 25 MG tablet Take 1 tablet (25 mg total) by mouth daily. 30 tablet 3  . Multiple Vitamin (MULTIVITAMIN WITH MINERALS) TABS tablet Take 1 tablet by mouth daily.    . potassium chloride (KLOR-CON) 10 MEQ tablet  Take 1 tablet (10 mEq total) by mouth daily. 30 tablet 3   No current facility-administered medications on file prior to visit.    BP 117/78 (BP Location: Right Arm, Patient Position: Sitting, Cuff Size: Small)   Pulse 76   Temp 98.2 F (36.8 C) (Oral)   Resp 16   Ht 5\' 2"  (1.575 m)   Wt 153 lb 9.6 oz (69.7 kg)   SpO2 100%   BMI 28.09 kg/m       Objective:   Physical Exam Constitutional:      Appearance: She is well-developed and well-nourished.  Cardiovascular:     Rate and Rhythm: Normal rate and regular rhythm.     Heart sounds: Normal heart sounds. No murmur heard.   Pulmonary:     Effort: Pulmonary effort is normal. No respiratory distress.     Breath sounds: Normal breath sounds. No wheezing.  Psychiatric:        Mood and Affect: Mood and affect normal.        Behavior: Behavior normal.        Thought Content: Thought content normal.        Judgment: Judgment normal.           Assessment & Plan:  Depression/anxiety- seems to be improving following restart of pristiq. Continue same. Recommended that she try to establish with a counselor and she will consider this.  She continues to use xanax on a prn basis.  ETOH abuse/transaminitis- she reports she is not currently drinking. She has attended AA in the past but is not currently attending. I encouraged her to re-establish. Obtain follow up LFT's.   Cardiomyopathy- discussed with the patient that her alcohol abuse could be a contributing factor.  She has an upcoming appointment with cardiology.  Prolonged QT- avoid QT prolonging agents.   Pancytopenia-  Lab Results  Component Value Date   WBC 3.7 (L) 11/15/2020   HGB 11.0 (L) 11/15/2020   HCT 33.0 (L) 11/15/2020   MCV 96.5 11/15/2020   PLT 123 (L) 11/15/2020   Repeat CBC.    This visit occurred during the SARS-CoV-2 public health emergency.  Safety protocols were in place, including screening questions prior to the visit, additional usage of staff PPE,  and extensive cleaning of exam room while observing appropriate contact time as indicated for disinfecting solutions.

## 2020-11-27 NOTE — Patient Instructions (Signed)
Please complete lab work prior to leaving.   

## 2020-11-29 MED FILL — CARVEDILOL 3.125 MG TABLET: 3.125 | 15 days supply | Qty: 30 | Fill #1

## 2020-12-05 ENCOUNTER — Other Ambulatory Visit (HOSPITAL_COMMUNITY): Payer: Self-pay | Admitting: Cardiovascular Disease

## 2020-12-05 DIAGNOSIS — I10 Essential (primary) hypertension: Secondary | ICD-10-CM | POA: Diagnosis not present

## 2020-12-05 DIAGNOSIS — I426 Alcoholic cardiomyopathy: Secondary | ICD-10-CM | POA: Diagnosis not present

## 2020-12-11 ENCOUNTER — Other Ambulatory Visit: Payer: Self-pay | Admitting: Family

## 2020-12-12 ENCOUNTER — Other Ambulatory Visit: Payer: Self-pay | Admitting: Family

## 2020-12-22 ENCOUNTER — Other Ambulatory Visit (HOSPITAL_BASED_OUTPATIENT_CLINIC_OR_DEPARTMENT_OTHER): Payer: Self-pay

## 2020-12-26 ENCOUNTER — Other Ambulatory Visit (HOSPITAL_BASED_OUTPATIENT_CLINIC_OR_DEPARTMENT_OTHER): Payer: Self-pay

## 2021-01-04 ENCOUNTER — Other Ambulatory Visit: Payer: Self-pay | Admitting: Family

## 2021-01-04 ENCOUNTER — Other Ambulatory Visit (HOSPITAL_COMMUNITY): Payer: Self-pay

## 2021-01-04 MED ORDER — ALPRAZOLAM 0.5 MG PO TABS
ORAL_TABLET | Freq: Two times a day (BID) | ORAL | 0 refills | Status: DC | PRN
  Filled 2021-01-04: qty 30, 15d supply, fill #0

## 2021-01-04 NOTE — Telephone Encounter (Signed)
Requesting: xanax Contract:03/2020 UDS:03/2020 Last Visit:10/2020 Next Visit:01/2021 Last Refill:10/2020  Please Advise

## 2021-01-06 ENCOUNTER — Other Ambulatory Visit (HOSPITAL_COMMUNITY): Payer: Self-pay

## 2021-01-24 ENCOUNTER — Ambulatory Visit: Payer: 59 | Admitting: Family

## 2021-01-24 ENCOUNTER — Other Ambulatory Visit (HOSPITAL_COMMUNITY): Payer: Self-pay

## 2021-01-24 MED FILL — Desvenlafaxine Succinate Tab ER 24HR 25 MG (Base Equiv): ORAL | 90 days supply | Qty: 90 | Fill #0 | Status: CN

## 2021-01-25 ENCOUNTER — Other Ambulatory Visit (HOSPITAL_COMMUNITY): Payer: Self-pay

## 2021-01-26 ENCOUNTER — Other Ambulatory Visit (HOSPITAL_COMMUNITY): Payer: Self-pay

## 2021-01-26 MED FILL — Potassium Chloride Tab ER 10 mEq: ORAL | 30 days supply | Qty: 30 | Fill #0 | Status: AC

## 2021-01-26 MED FILL — Carvedilol Tab 3.125 MG: ORAL | 30 days supply | Qty: 60 | Fill #0 | Status: AC

## 2021-01-26 MED FILL — Losartan Potassium Tab 25 MG: ORAL | 30 days supply | Qty: 30 | Fill #0 | Status: AC

## 2021-02-13 ENCOUNTER — Other Ambulatory Visit (HOSPITAL_COMMUNITY): Payer: Self-pay

## 2021-02-13 ENCOUNTER — Other Ambulatory Visit: Payer: Self-pay | Admitting: Family

## 2021-02-13 MED ORDER — ALPRAZOLAM 0.5 MG PO TABS
ORAL_TABLET | Freq: Two times a day (BID) | ORAL | 0 refills | Status: DC | PRN
  Filled 2021-02-13: qty 30, 15d supply, fill #0

## 2021-02-13 NOTE — Telephone Encounter (Signed)
Requesting: alprazolam Contract: 04/05/20 UDS: 04/05/20 Last Visit: 11/27/20 Next Visit: 02/23/21 Last Refill: 01/04/21  Please Advise

## 2021-02-23 ENCOUNTER — Ambulatory Visit: Payer: 59 | Admitting: Family

## 2021-03-22 ENCOUNTER — Other Ambulatory Visit: Payer: Self-pay | Admitting: Family

## 2021-03-22 ENCOUNTER — Other Ambulatory Visit (HOSPITAL_COMMUNITY): Payer: Self-pay

## 2021-03-22 MED ORDER — ALPRAZOLAM 0.5 MG PO TABS
ORAL_TABLET | Freq: Two times a day (BID) | ORAL | 0 refills | Status: DC | PRN
Start: 2021-03-22 — End: 2021-04-20
  Filled 2021-03-22: qty 30, 15d supply, fill #0

## 2021-03-22 NOTE — Telephone Encounter (Signed)
Requesting: alprazolam 0.5mg  Contract: 04/05/2020 UDS: 04/05/2020 Last Visit: 11/27/2020 Next Visit: 03/30/2021 Last Refill: 02/13/2021 #30 and 0RF  Please Advise

## 2021-03-23 ENCOUNTER — Other Ambulatory Visit (HOSPITAL_COMMUNITY): Payer: Self-pay

## 2021-03-30 ENCOUNTER — Ambulatory Visit: Payer: 59 | Admitting: Family

## 2021-04-20 ENCOUNTER — Other Ambulatory Visit: Payer: Self-pay | Admitting: Family

## 2021-04-20 NOTE — Telephone Encounter (Signed)
Requesting: alprazolam 0.5mg  Contract: 04/05/2020 UDS: 04/05/2020  Last Visit: 11/27/2020 Next Visit: None Last Refill: 03/22/2021 #30 and 0RF  Please Advise

## 2021-04-23 ENCOUNTER — Other Ambulatory Visit (HOSPITAL_COMMUNITY): Payer: Self-pay

## 2021-04-23 MED ORDER — ALPRAZOLAM 0.5 MG PO TABS
ORAL_TABLET | Freq: Two times a day (BID) | ORAL | 0 refills | Status: AC | PRN
  Filled 2021-04-23: qty 30, 15d supply, fill #0

## 2021-04-23 NOTE — Telephone Encounter (Signed)
Please contact pt to schedule a follow up visit.  

## 2021-04-24 NOTE — Telephone Encounter (Signed)
Appt scheduled 05/11/21.

## 2021-05-11 ENCOUNTER — Ambulatory Visit: Payer: 59 | Admitting: Family

## 2021-05-11 NOTE — Progress Notes (Incomplete)
Subjective:   By signing my name below, I, Shehryar Baig, attest that this documentation has been prepared under the direction and in the presence of Sandford Craze NP. 05/11/2021    Patient ID: Pamela Morris, female    DOB: 06-21-1976, 45 y.o.   MRN: [id number]  No chief complaint on file.   HPI Patient is in today for a office visit.    Health Maintenance Due  Topic Date Due   Pneumococcal Vaccine 46-47 Years old (1 - PCV) Never done   TETANUS/TDAP  Never done   PAP SMEAR-Modifier  12/26/2019   COLONOSCOPY (Pts 45-31yrs Insurance coverage will need to be confirmed)  Never done   MAMMOGRAM  08/03/2020   COVID-19 Vaccine (3 - Booster for Pfizer series) 10/12/2020   INFLUENZA VACCINE  04/30/2021    Past Medical History:  Diagnosis Date   Blood type, Rh negative    Prolonged QT interval 11/12/2020   Yeast infection     Past Surgical History:  Procedure Laterality Date   BREAST ENHANCEMENT SURGERY  07/2014   CESAREAN SECTION     LAPAROSCOPIC APPENDECTOMY N/A 04/11/2016   Procedure: APPENDECTOMY LAPAROSCOPIC;  Surgeon: Violeta Gelinas, MD;  Location: [facility] OR;  Service: General;  Laterality: N/A;   lipoma Left 2007    Family History  Problem Relation Age of Onset   Peptic Ulcer Father    Hypertension Mother    Aneurysm Mother     Social History   Socioeconomic History   Marital status: Divorced    Spouse name: Not on file   Number of children: Not on file   Years of education: Not on file   Highest education level: Not on file  Occupational History   Not on file  Tobacco Use   Smoking status: Former    Packs/day: 0.25    Years: 2.00    Pack years: 0.50    Types: Cigarettes   Smokeless tobacco: Never  Vaping Use   Vaping Use: Every day  Substance and Sexual Activity   Alcohol use: Yes    Comment: occasiona;   Drug use: No   Sexual activity: Yes    Birth control/protection: I.U.D.    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   Divorced   2 dogs ([employer] representative and Administrator)   Enjoys reading, music, movies, fishing, beach, exercise, spending time with children   Social Determinants of Corporate investment banker Strain: Not on [employer] Insecurity: Not on file  Transportation Needs: Not on file  Physical Activity: Not on file  Stress: Not on file  Social Connections: Not on file  Intimate Partner Violence: Not on file    Outpatient Medications Prior to Visit  Medication Sig Dispense Refill   ALPRAZolam (XANAX) 0.5 MG tablet TAKE 1 TABLET BY MOUTH 2 TIMES DAILY AS NEEDED FOR ANXIETY 30 tablet 0   carvedilol (COREG) 3.125 MG tablet TAKE 1 TABLET BY MOUTH TWICE DAILY 60 tablet 6   carvedilol (COREG) 3.125 MG tablet TAKE 1 TABLET BY MOUTH 2 TIMES DAILY WITH A MEAL 30 tablet 3   Desvenlafaxine Succinate ER 25 MG TB24 TAKE 1 TABLET BY MOUTH ONCE A DAY 90 tablet 1   fluconazole (DIFLUCAN) 150 MG tablet TAKE 1 TABLET BY MOUTH NOW 1 tablet 1  folic acid (FOLVITE) 1 MG tablet TAKE 1 TABLET BY MOUTH DAILY FOR 5 DAYS 5 tablet 0   ibuprofen (ADVIL) 200 MG tablet Take 400 mg by mouth every 6 (six) hours as needed for fever, headache or mild pain.     levonorgestrel (MIRENA) 20 MCG/24HR IUD 1 each by Intrauterine route once.     losartan (COZAAR) 25 MG tablet TAKE 1 TABLET (25 MG TOTAL) BY MOUTH DAILY. 30 tablet 3   Multiple Vitamin (MULTIVITAMIN WITH MINERALS) TABS tablet Take 1 tablet by mouth daily.     potassium chloride (KLOR-CON) 10 MEQ tablet TAKE 1 TABLET BY MOUTH ONCE A DAY 30 tablet 3   thiamine 100 MG tablet TAKE 1 TABLET (100 MG TOTAL) BY MOUTH DAILY FOR 5 DAYS. 5 tablet 0   No facility-administered medications prior to visit.    Allergies  Allergen Reactions   Lexapro [Escitalopram Oxalate] Other (See Comments)    LACK OF EMOTION    ROS     Objective:    Physical  Exam Constitutional:      General: She is not in acute distress.    Appearance: Normal appearance. She is not ill-appearing.  HENT:     Head: Normocephalic and atraumatic.     Right Ear: External ear normal.     Left Ear: External ear normal.  Eyes:     Extraocular Movements: Extraocular movements intact.     Pupils: Pupils are equal, round, and reactive to light.  Cardiovascular:     Rate and Rhythm: Normal rate and regular rhythm.     Heart sounds: Normal heart sounds. No murmur heard.   No gallop.  Pulmonary:     Effort: Pulmonary effort is normal. No respiratory distress.     Breath sounds: Normal breath sounds. No wheezing or rales.  Skin:    General: Skin is warm and dry.  Neurological:     Mental Status: She is alert and oriented to person, place, and time.  Psychiatric:        Behavior: Behavior normal.    There were no vitals taken for this visit. Wt Readings from Last 3 Encounters:  11/27/20 153 lb 9.6 oz (69.7 kg)  07/26/20 154 lb (69.9 kg)  04/05/20 165 lb (74.8 kg)       Assessment & Plan:   Problem List Items Addressed This Visit   None    No orders of the defined types were placed in this encounter.   I, Sandford Craze NP, personally preformed the services described in this documentation.  All medical record entries made by the scribe were at my direction and in my presence.  I have reviewed the chart and discharge instructions (if applicable) and agree that the record reflects my personal performance and is accurate and complete. 05/11/2021   I,Shehryar Baig,acting as a Neurosurgeon for Lemont Fillers, NP.,have documented all relevant documentation on the behalf of Lemont Fillers, NP,as directed by  Lemont Fillers, NP while in the presence of Lemont Fillers, NP.   Shehryar [employer]

## 2021-05-15 ENCOUNTER — Other Ambulatory Visit (HOSPITAL_COMMUNITY): Payer: Self-pay

## 2021-05-15 MED FILL — Potassium Chloride Tab ER 10 mEq: ORAL | 30 days supply | Qty: 30 | Fill #1 | Status: AC

## 2021-05-15 MED FILL — Carvedilol Tab 3.125 MG: ORAL | 30 days supply | Qty: 60 | Fill #1 | Status: AC

## 2021-05-15 MED FILL — Losartan Potassium Tab 25 MG: ORAL | 30 days supply | Qty: 30 | Fill #1 | Status: AC

## 2021-05-21 ENCOUNTER — Other Ambulatory Visit (HOSPITAL_COMMUNITY): Payer: Self-pay

## 2022-10-11 ENCOUNTER — Encounter (HOSPITAL_COMMUNITY): Payer: Self-pay | Admitting: *Deleted

## 2022-10-11 ENCOUNTER — Other Ambulatory Visit: Payer: Self-pay

## 2022-10-11 ENCOUNTER — Emergency Department (HOSPITAL_COMMUNITY)
Admission: EM | Admit: 2022-10-11 | Discharge: 2022-10-12 | Disposition: A | Discharge status: Home / Self Care | Payer: Commercial Managed Care - PPO | Attending: Student | Admitting: Student

## 2022-10-11 DIAGNOSIS — E871 Hypo-osmolality and hyponatremia: Secondary | ICD-10-CM | POA: Insufficient documentation

## 2022-10-11 DIAGNOSIS — Y900 Blood alcohol level of less than 20 mg/100 ml: Secondary | ICD-10-CM | POA: Insufficient documentation

## 2022-10-11 DIAGNOSIS — E876 Hypokalemia: Secondary | ICD-10-CM | POA: Insufficient documentation

## 2022-10-11 DIAGNOSIS — F1010 Alcohol abuse, uncomplicated: Secondary | ICD-10-CM | POA: Insufficient documentation

## 2022-10-11 DIAGNOSIS — E860 Dehydration: Secondary | ICD-10-CM | POA: Insufficient documentation

## 2022-10-11 DIAGNOSIS — I4581 Long QT syndrome: Secondary | ICD-10-CM | POA: Insufficient documentation

## 2022-10-11 DIAGNOSIS — R748 Abnormal levels of other serum enzymes: Secondary | ICD-10-CM | POA: Insufficient documentation

## 2022-10-11 LAB — COMPREHENSIVE METABOLIC PANEL
ALT: 74 U/L — ABNORMAL HIGH (ref 0–44)
AST: 199 U/L — ABNORMAL HIGH (ref 15–41)
Albumin: 3.8 g/dL (ref 3.5–5.0)
Alkaline Phosphatase: 149 U/L — ABNORMAL HIGH (ref 38–126)
Anion gap: 14 (ref 5–15)
BUN: <5 mg/dL — ABNORMAL LOW (ref 6–20)
CO2: 28 mmol/L (ref 22–32)
Calcium: 8.1 mg/dL — ABNORMAL LOW (ref 8.9–10.3)
Chloride: 86 mmol/L — ABNORMAL LOW (ref 98–111)
Creatinine, Ser: 0.67 mg/dL (ref 0.44–1.00)
GFR, Estimated: >60 mL/min (ref >60)
Glucose, Bld: 122 mg/dL — ABNORMAL HIGH (ref 70–99)
Potassium: 2.3 mmol/L — CL (ref 3.5–5.1)
Sodium: 128 mmol/L — ABNORMAL LOW (ref 135–145)
Total Bilirubin: 2.2 mg/dL — ABNORMAL HIGH (ref 0.3–1.2)
Total Protein: 7.4 g/dL (ref 6.5–8.1)

## 2022-10-11 LAB — URINALYSIS, ROUTINE W REFLEX MICROSCOPIC
Bacteria, UA: NONE SEEN
Bilirubin Urine: NEGATIVE
Glucose, UA: NEGATIVE mg/dL
Hgb urine dipstick: NEGATIVE
Ketones, ur: NEGATIVE mg/dL
Leukocytes,Ua: NEGATIVE
Nitrite: NEGATIVE
Protein, ur: NEGATIVE mg/dL
Specific Gravity, Urine: 1.001 — ABNORMAL LOW (ref 1.005–1.030)
pH: 7 (ref 5.0–8.0)

## 2022-10-11 LAB — CBC
HCT: 35.4 % — ABNORMAL LOW (ref 36.0–46.0)
Hemoglobin: 12.3 g/dL (ref 12.0–15.0)
MCH: 34 pg (ref 26.0–34.0)
MCHC: 34.7 g/dL (ref 30.0–36.0)
MCV: 97.8 fL (ref 80.0–100.0)
Platelets: 240 10*3/uL (ref 150–400)
RBC: 3.62 MIL/uL — ABNORMAL LOW (ref 3.87–5.11)
RDW: 14.8 % (ref 11.5–15.5)
WBC: 9.9 10*3/uL (ref 4.0–10.5)
nRBC: 0 % (ref 0.0–0.2)

## 2022-10-11 LAB — I-STAT BETA HCG BLOOD, ED (MC, WL, AP ONLY): I-stat hCG, quantitative: <5 m[IU]/mL (ref <5)

## 2022-10-11 LAB — LIPASE, BLOOD: Lipase: 53 U/L — ABNORMAL HIGH (ref 11–51)

## 2022-10-11 LAB — SALICYLATE LEVEL: Salicylate Lvl: <7 mg/dL — ABNORMAL LOW (ref 7.0–30.0)

## 2022-10-11 LAB — ACETAMINOPHEN LEVEL: Acetaminophen (Tylenol), Serum: <10 ug/mL — ABNORMAL LOW (ref 10–30)

## 2022-10-11 LAB — ETHANOL: Alcohol, Ethyl (B): <10 mg/dL (ref <10)

## 2022-10-11 NOTE — ED Notes (Signed)
Pot called from the lab 2.3  reported to the triage pa rob browning

## 2022-10-11 NOTE — ED Provider Triage Note (Signed)
Emergency Medicine Provider Triage Evaluation Note  Pamela Morris , a 47 y.o. female  was evaluated in triage.  Pt complains of feeling like her teeth are melting, she has a history of alcohol abuse, reports that she has been binge drinking for the last 2 weeks, with plenty of vomiting, she reports that she has been struggling with alcoholism for years, and had been in rehab, but relapsed around 9 months ago.  She reports her last drink was around 6 PM tonight.  Review of Systems  Positive: Nausea, vomiting, abdominal pain, dental pain Negative: Chest pain, shob  Physical Exam  BP (!) 140/99   Pulse (!) 124   Temp 99.2 F (37.3 C) (Oral)   Resp 18   Ht 5\' 2"  (1.575 m)   Wt 69.7 kg   SpO2 94%   BMI 28.10 kg/m  Gen:   Awake, no distress   Resp:  Normal effort  MSK:   Moves extremities without difficulty  Other:  Patient is anxious, tearful, she is tachycardic, she has subacute fever 99.2 in triage  Medical Decision Making  Medically screening exam initiated at 9:38 PM.  Appropriate orders placed.  FRANCIE KEELING was informed that the remainder of the evaluation will be completed by another provider, this initial triage assessment does not replace that evaluation, and the importance of remaining in the ED until their evaluation is complete.  Workup initiated   Dorien Chihuahua 10/11/22 2138

## 2022-10-11 NOTE — ED Triage Notes (Signed)
The pt has been binge drinking for the past 2 weeks she is unable to eat or drink  last  had a drink last nightlmp  iud

## 2022-10-12 ENCOUNTER — Other Ambulatory Visit: Payer: Self-pay

## 2022-10-12 ENCOUNTER — Other Ambulatory Visit (HOSPITAL_COMMUNITY): Payer: Self-pay

## 2022-10-12 LAB — COMPREHENSIVE METABOLIC PANEL
ALT: 67 U/L — ABNORMAL HIGH (ref 0–44)
AST: 151 U/L — ABNORMAL HIGH (ref 15–41)
Albumin: 3.8 g/dL (ref 3.5–5.0)
Alkaline Phosphatase: 125 U/L (ref 38–126)
Anion gap: 16 — ABNORMAL HIGH (ref 5–15)
BUN: <5 mg/dL — ABNORMAL LOW (ref 6–20)
CO2: 27 mmol/L (ref 22–32)
Calcium: 8.1 mg/dL — ABNORMAL LOW (ref 8.9–10.3)
Chloride: 87 mmol/L — ABNORMAL LOW (ref 98–111)
Creatinine, Ser: 0.64 mg/dL (ref 0.44–1.00)
GFR, Estimated: >60 mL/min (ref >60)
Glucose, Bld: 106 mg/dL — ABNORMAL HIGH (ref 70–99)
Potassium: 2.5 mmol/L — CL (ref 3.5–5.1)
Sodium: 130 mmol/L — ABNORMAL LOW (ref 135–145)
Total Bilirubin: 2.2 mg/dL — ABNORMAL HIGH (ref 0.3–1.2)
Total Protein: 7.2 g/dL (ref 6.5–8.1)

## 2022-10-12 LAB — MAGNESIUM: Magnesium: 1.4 mg/dL — ABNORMAL LOW (ref 1.7–2.4)

## 2022-10-12 LAB — POTASSIUM: Potassium: 3.2 mmol/L — ABNORMAL LOW (ref 3.5–5.1)

## 2022-10-12 MED ORDER — POTASSIUM CHLORIDE CRYS ER 20 MEQ PO TBCR: 20.0000 meq | EXTENDED_RELEASE_TABLET | Freq: Every day | ORAL | 0 refills | Status: AC

## 2022-10-12 MED ORDER — POTASSIUM CHLORIDE CRYS ER 20 MEQ PO TBCR
40.0000 meq | EXTENDED_RELEASE_TABLET | Freq: Once | ORAL | Status: AC
  Administered 2022-10-12: 40 meq via ORAL
  Filled 2022-10-12: qty 2

## 2022-10-12 MED ORDER — KETOROLAC TROMETHAMINE 15 MG/ML IJ SOLN
15.0000 mg | Freq: Once | INTRAMUSCULAR | Status: AC
  Administered 2022-10-12: 15 mg via INTRAVENOUS
  Filled 2022-10-12: qty 1

## 2022-10-12 MED ORDER — POTASSIUM CHLORIDE 10 MEQ/100ML IV SOLN
10.0000 meq | Freq: Once | INTRAVENOUS | Status: AC
  Administered 2022-10-12: 10 meq via INTRAVENOUS
  Filled 2022-10-12: qty 100

## 2022-10-12 MED ORDER — LORAZEPAM 1 MG PO TABS
1.0000 mg | ORAL_TABLET | Freq: Once | ORAL | Status: AC
  Administered 2022-10-12: 1 mg via ORAL
  Filled 2022-10-12: qty 1

## 2022-10-12 MED ORDER — POTASSIUM CHLORIDE CRYS ER 20 MEQ PO TBCR
20.0000 meq | EXTENDED_RELEASE_TABLET | Freq: Every day | ORAL | 0 refills | Status: DC
  Filled 2022-10-12: qty 14, 14d supply, fill #0

## 2022-10-12 MED ORDER — MAGNESIUM OXIDE -MG SUPPLEMENT 400 (240 MG) MG PO TABS
800.0000 mg | ORAL_TABLET | Freq: Once | ORAL | Status: AC
  Administered 2022-10-12: 800 mg via ORAL
  Filled 2022-10-12: qty 2

## 2022-10-12 MED ORDER — LIDOCAINE VISCOUS HCL 2 % MT SOLN
15.0000 mL | Freq: Once | OROMUCOSAL | Status: AC
  Administered 2022-10-12: 15 mL via OROMUCOSAL
  Filled 2022-10-12: qty 15

## 2022-10-12 NOTE — ED Provider Notes (Signed)
Signed up to see patient at beginning of shift. Multiple room checks and patient not in room. Last check 0715 and patient still not in room to be interviewed and evaluated.    [name] 10/12/22 [id number]    [name], MD 10/12/22 [id number]

## 2022-10-12 NOTE — Discharge Instructions (Signed)
Thank you for letting us take care of you today.  You were treated for a very low potassium level. Fortunately, this improved after the supplementation we gave you. I have prescribed you 2 weeks of potassium to take at home. Please fill this prescription and take it as prescribed.  It is very important to follow up with a primary care provider to have this rechecked. If unable to follow up with your previous PCP, I have provided the names of 2 clinics you may contact to arrange an appointment.  I have also provided resources for substance abuse treatment should you be interested in this. With your multiple electrolyte abnormalities today, it will be really important for you to stop drinking.   If you start feeling worse or develop chest pain, shortness of breath, recurrent uncontrolled vomiting, fever, abdominal pain, or other concerns, it is very important to be re-evaluated in nearest emergency department.

## 2022-10-12 NOTE — ED Provider Notes (Signed)
[facility] Provider Note   CSN: [id number] Arrival date & time: 10/11/22  1906     History  Chief Complaint  Patient presents with   Emesis    Pamela Morris is a 47 y.o. female with a past medical history of alcohol use disorder and long QTc syndrome who presents to the ED complaining of "feeling like my teeth are melting."  Patient reports that she has been binge drinking for at least the past 2 weeks.  When first asked the last time that she consumed alcohol, patient stated that it was yesterday but that she only had 2 shots throughout the day.  When asked again, patient stated that this is untrue and that she takes large gulps of alcohol approximately every hour with the last drink being sometime late last night.  Patient states that she has a longstanding history of alcoholism and went to rehab approximately 1-1/2 to 2 years ago.  States that she is having some generalized abdominal pain as well as many episodes of vomiting over the past few days.  States that she has also had some diarrhea that this has been less significant than the vomiting and her last loose stool was yesterday.  She denies hematemesis, hematochezia, or melena.  She denies fever, chills, cough, congestion, shortness of breath, chest pain, back pain, urinary symptoms, or any other complaints apart from increased anxiety secondary to alcohol withdrawal.  She reports that she has a history of depression and anxiety but has been off her medications since approximately 2022 due to lack of employment/health insurance.  She denies suicidal ideation, violent/homicidal ideation, paranoia, or hallucinations.      Home Medications Denies current prescription medications  Allergies    Lexapro [escitalopram oxalate]    Review of Systems   Review of Systems  Constitutional:  Positive for appetite change. Negative for activity change, chills, fatigue and fever.  HENT:  Positive for  dental problem. Negative for ear pain, rhinorrhea, sinus pressure, sinus pain, sore throat, trouble swallowing and voice change.   Eyes:  Negative for photophobia, pain and visual disturbance.  Respiratory:  Negative for cough, chest tightness, shortness of breath and wheezing.   Cardiovascular:  Negative for chest pain, palpitations and leg swelling.  Gastrointestinal:  Positive for abdominal pain, diarrhea, nausea and vomiting. Negative for blood in stool and constipation.  Genitourinary:  Negative for decreased urine volume, difficulty urinating, dysuria, flank pain, frequency and hematuria.  Musculoskeletal:  Negative for arthralgias, back pain, gait problem, neck pain and neck stiffness.  Skin:  Negative for color change and rash.  Neurological:  Positive for weakness (generalized). Negative for dizziness, seizures, syncope, speech difficulty, light-headedness, numbness and headaches.  Psychiatric/Behavioral:  Positive for sleep disturbance (insomnia x 3 days). Negative for hallucinations and suicidal ideas. The patient is nervous/anxious.   All other systems reviewed and are negative.   Physical Exam Updated Vital Signs BP 110/79   Pulse 84   Temp 98.8 F (37.1 C) (Oral)   Resp 20   Ht 5\' 2"  (1.575 m)   Wt 69.7 kg   SpO2 94%   BMI 28.10 kg/m  Physical Exam Vitals and nursing note reviewed.  Constitutional:      General: She is not in acute distress.    Appearance: Normal appearance. She is not ill-appearing, toxic-appearing or diaphoretic.  HENT:     Head: Normocephalic and atraumatic.     Mouth/Throat:     Mouth: Mucous membranes are moist.  Dentition: Normal dentition. No dental tenderness, gingival swelling, dental caries or dental abscesses.     Pharynx: Oropharynx is clear. Uvula midline. No pharyngeal swelling, oropharyngeal exudate, posterior oropharyngeal erythema or uvula swelling.  Eyes:     Extraocular Movements: Extraocular movements intact.      Conjunctiva/sclera: Conjunctivae normal.     Pupils: Pupils are equal, round, and reactive to light.  Cardiovascular:     Rate and Rhythm: Normal rate and regular rhythm.     Heart sounds: No murmur heard. Pulmonary:     Effort: Pulmonary effort is normal. No respiratory distress.     Breath sounds: Normal breath sounds. No wheezing, rhonchi or rales.  Abdominal:     General: Abdomen is flat. There is no distension.     Palpations: Abdomen is soft.     Tenderness: There is no abdominal tenderness. There is no right CVA tenderness, left CVA tenderness, guarding or rebound.  Musculoskeletal:        General: Normal range of motion.     Cervical back: Normal range of motion and neck supple. No rigidity.     Right lower leg: No edema.     Left lower leg: No edema.  Skin:    General: Skin is warm and dry.     Capillary Refill: Capillary refill takes less than 2 seconds.     Coloration: Skin is not jaundiced or pale.  Neurological:     General: No focal deficit present.     Mental Status: She is alert and oriented to person, place, and time.     Cranial Nerves: No cranial nerve deficit.     Motor: No weakness.  Psychiatric:        Attention and Perception: She is inattentive (slightly). She does not perceive auditory or visual hallucinations.        Mood and Affect: Mood is anxious (moderately).        Speech: Speech is rapid and pressured. Speech is not slurred.        Behavior: Behavior is hyperactive (slightly). Behavior is not slowed, aggressive, withdrawn or combative. Behavior is cooperative.        Thought Content: Thought content is not paranoid or delusional. Thought content does not include homicidal or suicidal ideation. Thought content does not include homicidal or suicidal plan.        Judgment: Judgment normal.     ED Results / Procedures / Treatments   Labs (all labs ordered are listed, but only abnormal results are displayed) Labs Reviewed  LIPASE, BLOOD - Abnormal;  Notable for the following components:      Result Value   Lipase 53 (*)    All other components within normal limits  COMPREHENSIVE METABOLIC PANEL - Abnormal; Notable for the following components:   Sodium 128 (*)    Potassium 2.3 (*)    Chloride 86 (*)    Glucose, Bld 122 (*)    BUN <5 (*)    Calcium 8.1 (*)    AST 199 (*)    ALT 74 (*)    Alkaline Phosphatase 149 (*)    Total Bilirubin 2.2 (*)    All other components within normal limits  CBC - Abnormal; Notable for the following components:   RBC 3.62 (*)    HCT 35.4 (*)    All other components within normal limits  URINALYSIS, ROUTINE W REFLEX MICROSCOPIC - Abnormal; Notable for the following components:   Color, Urine STRAW (*)    Specific  Gravity, Urine 1.001 (*)    All other components within normal limits  SALICYLATE LEVEL - Abnormal; Notable for the following components:   Salicylate Lvl <7.0 (*)    All other components within normal limits  ACETAMINOPHEN LEVEL - Abnormal; Notable for the following components:   Acetaminophen (Tylenol), Serum <10 (*)    All other components within normal limits  COMPREHENSIVE METABOLIC PANEL - Abnormal; Notable for the following components:   Sodium 130 (*)    Potassium 2.5 (*)    Chloride 87 (*)    Glucose, Bld 106 (*)    BUN <5 (*)    Calcium 8.1 (*)    AST 151 (*)    ALT 67 (*)    Total Bilirubin 2.2 (*)    Anion gap 16 (*)    All other components within normal limits  MAGNESIUM - Abnormal; Notable for the following components:   Magnesium 1.4 (*)    All other components within normal limits  POTASSIUM - Abnormal; Notable for the following components:   Potassium 3.2 (*)    All other components within normal limits  ETHANOL  I-STAT BETA HCG BLOOD, ED ([facility], [facility], [facility] ONLY)    EKG Vent. rate 85 BPM PR interval 116 ms QRS duration 86 ms QT/QTcB 442/526 ms P-R-T axes -29 57 137 Sinus rhythm Borderline short PR interval No STEMI Prolonged QT interval  Radiology No  results found.  Procedures Procedures   Medications Ordered in ED Medications  potassium chloride 10 mEq in 100 mL IVPB (0 mEq Intravenous Stopped 10/12/22 0924)  potassium chloride SA (KLOR-CON M) CR tablet 40 mEq (40 mEq Oral Given 10/12/22 0816)  magnesium oxide (MAG-OX) tablet 800 mg (800 mg Oral Given 10/12/22 0818)  lidocaine (XYLOCAINE) 2 % viscous mouth solution 15 mL (15 mLs Mouth/Throat Given 10/12/22 0816)  LORazepam (ATIVAN) tablet 1 mg (1 mg Oral Given 10/12/22 0827)  ketorolac (TORADOL) 15 MG/ML injection 15 mg (15 mg Intravenous Given 10/12/22 2952)    ED Course/ Medical Decision Making/ A&P Clinical Course as of 10/12/22 1607  Sat Oct 12, 2022  1010 EKG 12-Lead [MK]    Clinical Course User Index [MK] Glendora Score, MD                             Medical Decision Making Amount and/or Complexity of Data Reviewed Labs: ordered. Decision-making details documented in ED Course. ECG/medicine tests: ordered. Decision-making details documented in ED Course.  Risk OTC drugs. Prescription drug management. Decision regarding hospitalization.   47 year old female with PMH alcohol use disorder presenting to ED with complaint as discussed above. No abnormalities to dentition noted on my exam or that of my attending physician. However, pt reports frequent binge drinking and vomiting and is a very poor historian so proceeded with workup as ordered by triage staff. Difficult to develop differential due to lack of good history and mostly unremarkable exam as mentioned above but with history of alcohol use disorder ruling out electrolyte disturbances, ACS, AKI, UTI, dehydration, dysrhythmia, psychiatric clearance. Pt is very anxious on exam but has no SI, HI, AVH, or indication for further psychiatric consult/evaluation. Blood work returned with multiple electrolyte disturbances. Suspect this is secondary to alcoholic hyperemesis. Abdomen soft, nontender, and without peritoneal signs.  Patient stated that she did not want any imaging studies due to lack of health insurance coverage/financial burden so will not order CT abd/pelvis for further assessment of  her symptoms. Discussed with patient that she does have a severe hypokalemia and though we have attempted to replete this with both IV and p.o. potassium it is recommended that she be admitted to the hospital for trending of her potassium levels especially in the setting of her history of prolonged QTc syndrome.  Discussed this option with patient who again expressed to me that due to her lack of health insurance coverage/financial burden of healthcare she does desire to be discharged home.  Discussed case with attending MD who agreed that as the only major abnormality is patient's hypokalemia and her vomiting is controlled as long as she does not consume alcohol, it is reasonable to discharge her home should a repeat potassium level be trending upward and more stable on recheck.  Patient would prefer to go this route so we will repeat potassium level and if this is the case discharged her home on p.o. potassium with strict instructions to follow-up outpatient and strict emergency department return precautions.  She reiterates that she is having no chest pain, no shortness of breath, no abdominal pain, and has not had any vomiting or diarrhea since yesterday.  Reports that she is also not interested in substance abuse counseling/rehab at this time. Repeat potassium at 3.2. Pt again states she wants to go home. Discussed need to be discharged on oral potassium. States she may or may not get this medication depending on if she can afford it. Extensively discussed need for primary care follow up and strict ED return precautions. Pt does seem somewhat agitated with discussion and makes unreasonable requests such as "if you can guarantee it will only cost me x amount I will do it but otherwise I do not know." As discussed multiple times, we will  discharge her as this is her desire but she is educated on importance of alcohol cessation, provided substance abuse counseling resources, and aware that should her condition decline she should return to ED.             Final Clinical Impression(s) / ED Diagnoses Final diagnoses:  Hypokalemia  Hyponatremia  Hypomagnesemia  Alcohol abuse  Dehydration  Elevated liver enzymes  Long Q-T syndrome    Rx / DC Orders ED Discharge Orders          Ordered    potassium chloride SA (KLOR-CON M) 20 MEQ tablet  Daily,   Status:  Discontinued        10/12/22 1301    potassium chloride SA (KLOR-CON M) 20 MEQ tablet  Daily        10/12/22 1317              [name] 10/12/22 1618    [name], MD 10/12/22 1913

## 2022-10-13 ENCOUNTER — Telehealth: Payer: Self-pay | Admitting: Family

## 2022-10-13 NOTE — Telephone Encounter (Signed)
Please contact pt to schedule ED follow up this week. Her potassium was very low in the ER so it is important that we recheck.

## 2022-10-14 ENCOUNTER — Other Ambulatory Visit: Payer: Self-pay

## 2022-10-14 NOTE — Telephone Encounter (Signed)
Left message on machine to call back  

## 2022-10-15 NOTE — Telephone Encounter (Signed)
Called but no answer, lvm for patient to call back and scheduled. MyChart message also sent with this information.
